# Patient Record
Sex: Female | Born: 1965 | Race: White | Hispanic: No | Marital: Married | Smoking: Former smoker
Health system: Southern US, Community
[De-identification: ages and names within clinical notes are randomized; demographics above are authoritative.]

## PROBLEM LIST (undated history)

## (undated) DIAGNOSIS — T7840XA Allergy, unspecified, initial encounter: Secondary | ICD-10-CM

## (undated) DIAGNOSIS — R87610 Atypical squamous cells of undetermined significance on cytologic smear of cervix (ASC-US): Secondary | ICD-10-CM

## (undated) HISTORY — DX: Allergy, unspecified, initial encounter: T78.40XA

## (undated) HISTORY — DX: Atypical squamous cells of undetermined significance on cytologic smear of cervix (ASC-US): R87.610

## (undated) HISTORY — PX: COLONOSCOPY: SHX174

---

## 2004-05-27 HISTORY — PX: BREAST CYST ASPIRATION: SHX578

## 2004-07-07 ENCOUNTER — Inpatient Hospital Stay: Payer: Self-pay | Admitting: Obstetrics & Gynecology

## 2006-08-19 ENCOUNTER — Ambulatory Visit: Payer: Self-pay | Admitting: Gastroenterology

## 2008-05-17 ENCOUNTER — Ambulatory Visit: Payer: Self-pay | Admitting: Family Medicine

## 2009-05-23 ENCOUNTER — Ambulatory Visit: Payer: Self-pay | Admitting: Family Medicine

## 2009-06-27 LAB — PSA: PSA: NEGATIVE

## 2010-05-25 ENCOUNTER — Ambulatory Visit: Payer: Self-pay

## 2011-11-13 ENCOUNTER — Ambulatory Visit: Payer: Self-pay | Admitting: Family Medicine

## 2014-05-09 ENCOUNTER — Ambulatory Visit: Payer: Self-pay | Admitting: Family Medicine

## 2014-05-16 LAB — BASIC METABOLIC PANEL
BUN: 9 mg/dL (ref 4–21)
Creatinine: 0.7 mg/dL (ref 0.5–1.1)
Glucose: 97 mg/dL
Potassium: 4.4 mmol/L (ref 3.4–5.3)
SODIUM: 140 mmol/L (ref 137–147)

## 2014-05-16 LAB — LIPID PANEL
Cholesterol: 186 mg/dL (ref 0–200)
HDL: 65 mg/dL (ref 35–70)
LDL CALC: 110 mg/dL
Triglycerides: 54 mg/dL (ref 40–160)

## 2014-05-24 ENCOUNTER — Ambulatory Visit: Payer: Self-pay | Admitting: Family Medicine

## 2014-05-24 LAB — HM MAMMOGRAPHY

## 2014-11-25 ENCOUNTER — Encounter: Payer: Self-pay | Admitting: Emergency Medicine

## 2014-11-25 ENCOUNTER — Ambulatory Visit (INDEPENDENT_AMBULATORY_CARE_PROVIDER_SITE_OTHER): Payer: BC Managed Care – PPO | Admitting: Family Medicine

## 2014-11-25 ENCOUNTER — Encounter: Payer: Self-pay | Admitting: Family Medicine

## 2014-11-25 VITALS — BP 118/60 | HR 88 | Temp 98.3°F | Resp 16 | Wt 176.0 lb

## 2014-11-25 DIAGNOSIS — N3090 Cystitis, unspecified without hematuria: Secondary | ICD-10-CM | POA: Insufficient documentation

## 2014-11-25 LAB — POCT URINALYSIS DIPSTICK
Bilirubin, UA: NEGATIVE
Glucose, UA: NEGATIVE
Ketones, UA: NEGATIVE
Nitrite, UA: POSITIVE
PROTEIN UA: NEGATIVE
SPEC GRAV UA: 1.01
Urobilinogen, UA: 0.2
pH, UA: 6

## 2014-11-25 MED ORDER — NITROFURANTOIN MONOHYD MACRO 100 MG PO CAPS
100.0000 mg | ORAL_CAPSULE | Freq: Two times a day (BID) | ORAL | Status: DC
Start: 2014-11-25 — End: 2020-02-03

## 2014-11-25 NOTE — Progress Notes (Signed)
Subjective:     Patient ID: Nicole Beard, female   DOB: 16-Sep-1965, 49 y.o.   MRN: [id number]  HPI  Chief Complaint  Patient presents with  . Urinary Tract Infection  "I've never had a urinary tract infection." Reports burning, urgency, and low back pain relieved by Uristat over the last week.   Review of Systems  Constitutional: Negative for fever and chills.       Objective:   Physical Exam  Constitutional: She appears well-developed and well-nourished. No distress.  Genitourinary:  No cva tenderness       Assessment:    1. Cystitis - nitrofurantoin, macrocrystal-monohydrate, (MACROBID) 100 MG capsule; Take 1 capsule (100 mg total) by mouth 2 (two) times daily.  Dispense: 14 capsule; Refill: 0 - POCT urinalysis dipstick - Urine culture    Plan:    Further f/u pending urine culture.

## 2014-11-25 NOTE — Patient Instructions (Signed)
Continue increased fluids and Uristat as needed

## 2015-07-11 ENCOUNTER — Encounter: Payer: Self-pay | Admitting: Family Medicine

## 2015-12-07 DIAGNOSIS — R87610 Atypical squamous cells of undetermined significance on cytologic smear of cervix (ASC-US): Secondary | ICD-10-CM

## 2015-12-07 HISTORY — DX: Atypical squamous cells of undetermined significance on cytologic smear of cervix (ASC-US): R87.610

## 2015-12-28 ENCOUNTER — Other Ambulatory Visit: Payer: Self-pay | Admitting: Family Medicine

## 2016-01-12 ENCOUNTER — Other Ambulatory Visit: Payer: Self-pay | Admitting: Obstetrics and Gynecology

## 2016-01-12 ENCOUNTER — Other Ambulatory Visit: Payer: Self-pay | Admitting: Obstetrics & Gynecology

## 2016-01-12 DIAGNOSIS — Z1231 Encounter for screening mammogram for malignant neoplasm of breast: Secondary | ICD-10-CM

## 2016-01-25 ENCOUNTER — Other Ambulatory Visit: Payer: Self-pay | Admitting: Obstetrics & Gynecology

## 2016-01-25 ENCOUNTER — Ambulatory Visit
Admission: RE | Admit: 2016-01-25 | Discharge: 2016-01-25 | Disposition: A | Discharge status: Home / Self Care | Payer: BC Managed Care – PPO | Source: Ambulatory Visit | Attending: Obstetrics & Gynecology | Admitting: Obstetrics & Gynecology

## 2016-01-25 DIAGNOSIS — Z1231 Encounter for screening mammogram for malignant neoplasm of breast: Secondary | ICD-10-CM

## 2017-01-20 ENCOUNTER — Ambulatory Visit (INDEPENDENT_AMBULATORY_CARE_PROVIDER_SITE_OTHER): Payer: BC Managed Care – PPO | Admitting: Obstetrics & Gynecology

## 2017-01-20 ENCOUNTER — Encounter: Payer: Self-pay | Admitting: Obstetrics & Gynecology

## 2017-01-20 VITALS — BP 120/82 | HR 84 | Ht 66.0 in | Wt 182.0 lb

## 2017-01-20 DIAGNOSIS — Z1211 Encounter for screening for malignant neoplasm of colon: Secondary | ICD-10-CM

## 2017-01-20 DIAGNOSIS — Z01419 Encounter for gynecological examination (general) (routine) without abnormal findings: Secondary | ICD-10-CM

## 2017-01-20 DIAGNOSIS — Z1231 Encounter for screening mammogram for malignant neoplasm of breast: Secondary | ICD-10-CM

## 2017-01-20 DIAGNOSIS — Z0000 Encounter for general adult medical examination without abnormal findings: Secondary | ICD-10-CM

## 2017-01-20 DIAGNOSIS — E559 Vitamin D deficiency, unspecified: Secondary | ICD-10-CM

## 2017-01-20 DIAGNOSIS — Z1239 Encounter for other screening for malignant neoplasm of breast: Secondary | ICD-10-CM

## 2017-01-20 DIAGNOSIS — Z87410 Personal history of cervical dysplasia: Secondary | ICD-10-CM | POA: Insufficient documentation

## 2017-01-20 NOTE — Progress Notes (Signed)
HPI:      [name] is a 51 y.o. [id number] who LMP was Patient's last menstrual period was 12/31/2016 (within days)., she presents today for her annual examination. The patient has no complaints today. The patient is sexually active. Her last pap: approximate date ASCUS, HPV NEG and was abnormal: 2017 and last mammogram: approximate date 2017 and was normal. The patient does perform self breast exams.  There is no notable family history of breast or ovarian cancer in her family.  The patient has regular exercise: yes.  The patient denies current symptoms of depression.   Still having periods.  No menpausal sx.s  GYN History: Contraception: none  PMHx: Past Medical History:  Diagnosis Date  . Atypical squamous cell changes of undetermined significance (ASCUS) on cervical cytology with negative high risk human papilloma virus (HPV) test result 12/07/2015   Past Surgical History:  Procedure Laterality Date  . BREAST CYST ASPIRATION Right 2006   neg   Family History  Problem Relation Age of Onset  . Cancer Mother        colon cancer  . Diabetes Father   . Heart disease Father   . Breast cancer Neg Hx   . Ovarian cancer Neg Hx    Social History  Substance Use Topics  . Smoking status: Former Research scientist (life sciences)  . Smokeless tobacco: Never Used  . Alcohol use No    Current Outpatient Prescriptions:  .  nitrofurantoin, macrocrystal-monohydrate, (MACROBID) 100 MG capsule, Take 1 capsule (100 mg total) by mouth 2 (two) times daily. (Patient not taking: Reported on 01/20/2017), Disp: 14 capsule, Rfl: 0 Allergies: Codeine and Penicillins  Review of Systems  Constitutional: Negative for chills, fever and malaise/fatigue.  HENT: Negative for congestion, sinus pain and sore throat.   Eyes: Negative for blurred vision and pain.  Respiratory: Negative for cough and wheezing.   Cardiovascular: Negative for chest pain and leg swelling.  Gastrointestinal: Negative for abdominal pain,  constipation, diarrhea, heartburn, nausea and vomiting.  Genitourinary: Negative for dysuria, frequency, hematuria and urgency.  Musculoskeletal: Negative for back pain, joint pain, myalgias and neck pain.  Skin: Negative for itching and rash.  Neurological: Negative for dizziness, tremors and weakness.  Endo/Heme/Allergies: Does not bruise/bleed easily.  Psychiatric/Behavioral: Negative for depression. The patient is not nervous/anxious and does not have insomnia.     Objective: BP 120/82   Pulse 84   Ht 5\' 6"  (1.676 m)   Wt 182 lb (82.6 kg)   LMP 12/31/2016 (Within Days)   BMI 29.38 kg/m   Filed Weights   01/20/17 0938  Weight: 182 lb (82.6 kg)   Body mass index is 29.38 kg/m. Physical Exam  Constitutional: She is oriented to person, place, and time. She appears well-developed and well-nourished. No distress.  Genitourinary: Rectum normal, vagina normal and uterus normal. Pelvic exam was performed with patient supine. There is no rash or lesion on the right labia. There is no rash or lesion on the left labia. Vagina exhibits no lesion. No bleeding in the vagina. Right adnexum does not display mass and does not display tenderness. Left adnexum does not display mass and does not display tenderness. Cervix does not exhibit motion tenderness, lesion, friability or polyp.   Uterus is mobile and midaxial. Uterus is not enlarged or exhibiting a mass.  HENT:  Head: Normocephalic and atraumatic. Head is without laceration.  Right Ear: Hearing normal.  Left Ear: Hearing normal.  Nose: No epistaxis.  No foreign bodies.  Mouth/Throat: Uvula is midline, oropharynx is clear and moist and mucous membranes are normal.  Eyes: Pupils are equal, round, and reactive to light.  Neck: Normal range of motion. Neck supple. No thyromegaly present.  Cardiovascular: Normal rate and regular rhythm.  Exam reveals no gallop and no friction rub.   No murmur heard. Pulmonary/Chest: Effort normal and breath  sounds normal. No respiratory distress. She has no wheezes. Right breast exhibits no mass, no skin change and no tenderness. Left breast exhibits no mass, no skin change and no tenderness.  Abdominal: Soft. Bowel sounds are normal. She exhibits no distension. There is no tenderness. There is no rebound.  Musculoskeletal: Normal range of motion.  Neurological: She is alert and oriented to person, place, and time. No cranial nerve deficit.  Skin: Skin is warm and dry.  Psychiatric: She has a normal mood and affect. Judgment normal.  Vitals reviewed.   Assessment:  ANNUAL EXAM 1. Annual physical exam   2. Screening for breast cancer   3. History of cervical dysplasia   4. Screen for colon cancer   5. Vitamin D deficiency      Screening Plan:            1.  Cervical Screening-  Pap smear done today  2. Breast screening- Exam annually and mammogram>40 planned   3. Colonoscopy every 10 years, Hemoccult testing - after age 7  4. Labs Vit D f/u test today.  Other labs normal last year  5. Counseling for contraception: no method  Other:  1. Annual physical exam  2. Screening for breast cancer - MM DIGITAL SCREENING BILATERAL; Future  3. History of cervical dysplasia - IGP, Aptima HPV  4. Screen for colon cancer - Ambulatory referral to Gastroenterology  5. Vitamin D deficiency - VITAMIN D 25 Hydroxy (Vit-D Deficiency, Fractures)      F/U  Return in about 1 year (around 01/20/2018) for Annual.  Barnett Applebaum, MD, [facility], [facility] 01/20/2017  10:05 AM

## 2017-01-20 NOTE — Patient Instructions (Signed)
PAP every year Mammogram every year    Call [phone] to schedule at [facility] Colonoscopy every 10 years Labs yearly (Vit D)

## 2017-01-21 ENCOUNTER — Other Ambulatory Visit: Payer: Self-pay | Admitting: Obstetrics & Gynecology

## 2017-01-21 LAB — VITAMIN D 25 HYDROXY (VIT D DEFICIENCY, FRACTURES): VIT D 25 HYDROXY: 18.3 ng/mL — AB (ref 30.0–100.0)

## 2017-01-21 MED ORDER — VITAMIN D (ERGOCALCIFEROL) 1.25 MG (50000 UNIT) PO CAPS: 50000.0000 [IU] | ORAL_CAPSULE | ORAL | 2 refills | Status: DC

## 2017-01-21 NOTE — Progress Notes (Signed)
Needs Rx for Vit D, message sent to patient on MyChart but do not have pharmacy.  Please call and find out pharmacy and then send Rx by fax.

## 2017-01-28 LAB — IGP, APTIMA HPV
HPV Aptima: NEGATIVE
PAP Smear Comment: 0

## 2017-01-28 LAB — PLEASE NOTE

## 2017-03-12 ENCOUNTER — Other Ambulatory Visit: Payer: Self-pay

## 2017-03-12 ENCOUNTER — Telehealth: Payer: Self-pay

## 2017-03-12 DIAGNOSIS — Z800 Family history of malignant neoplasm of digestive organs: Secondary | ICD-10-CM

## 2017-03-12 NOTE — Telephone Encounter (Signed)
Gastroenterology Pre-Procedure Review  Request Date: 05/12/17 Requesting Physician: Dr. Vicente Males  PATIENT REVIEW QUESTIONS: The patient responded to the following health history questions as indicated:    1. Are you having any GI issues? no 2. Do you have a personal history of Polyps? no 3. Do you have a family history of Colon Cancer or Polyps? yes (Mom Colon Cancer) 4. Diabetes Mellitus? no 5. Joint replacements in the past 12 months?no 6. Major health problems in the past 3 months?no 7. Any artificial heart valves, MVP, or defibrillator?no    MEDICATIONS & ALLERGIES:    Patient reports the following regarding taking any anticoagulation/antiplatelet therapy:   Plavix, Coumadin, Eliquis, Xarelto, Lovenox, Pradaxa, Brilinta, or Effient? no Aspirin? no  Patient confirms/reports the following medications:  Current Outpatient Prescriptions  Medication Sig Dispense Refill  . nitrofurantoin, macrocrystal-monohydrate, (MACROBID) 100 MG capsule Take 1 capsule (100 mg total) by mouth 2 (two) times daily. (Patient not taking: Reported on 01/20/2017) 14 capsule 0  . Vitamin D, Ergocalciferol, (DRISDOL) 50000 units CAPS capsule Take 1 capsule (50,000 Units total) by mouth every 7 (seven) days. For 3 months. 4 capsule 2   No current facility-administered medications for this visit.     Patient confirms/reports the following allergies:  Allergies  Allergen Reactions  . Codeine Nausea And Vomiting and Other (See Comments)  . Penicillins Rash    No orders of the defined types were placed in this encounter.   AUTHORIZATION INFORMATION Primary Insurance: 1D#: Group #:  Secondary Insurance: 1D#: Group #:  SCHEDULE INFORMATION: Date: 05/12/17 Time: Location:[facility]

## 2017-04-02 ENCOUNTER — Other Ambulatory Visit: Payer: Self-pay | Admitting: Obstetrics & Gynecology

## 2017-04-02 DIAGNOSIS — E559 Vitamin D deficiency, unspecified: Secondary | ICD-10-CM

## 2017-04-03 NOTE — Telephone Encounter (Signed)
Schedule appt for VIT D level (LAB ordered) and have her change from Rx therapy to over the counter 1000 U per day therapy of Vit D.

## 2017-04-03 NOTE — Telephone Encounter (Signed)
Left message to return call 

## 2017-04-16 ENCOUNTER — Other Ambulatory Visit: Payer: BC Managed Care – PPO

## 2017-04-16 DIAGNOSIS — E559 Vitamin D deficiency, unspecified: Secondary | ICD-10-CM

## 2017-04-17 LAB — VITAMIN D 25 HYDROXY (VIT D DEFICIENCY, FRACTURES): VIT D 25 HYDROXY: 22.7 ng/mL — AB (ref 30.0–100.0)

## 2017-05-12 ENCOUNTER — Ambulatory Visit: Payer: BC Managed Care – PPO | Admitting: Certified Registered"

## 2017-05-12 ENCOUNTER — Encounter: Payer: Self-pay | Admitting: *Deleted

## 2017-05-12 ENCOUNTER — Encounter
Admission: RE | Disposition: A | Discharge status: Home / Self Care | Payer: Self-pay | Source: Ambulatory Visit | Attending: Gastroenterology

## 2017-05-12 ENCOUNTER — Ambulatory Visit
Admission: RE | Admit: 2017-05-12 | Discharge: 2017-05-12 | Disposition: A | Discharge status: Home / Self Care | Payer: BC Managed Care – PPO | Source: Ambulatory Visit | Attending: Gastroenterology | Admitting: Gastroenterology

## 2017-05-12 DIAGNOSIS — Z880 Allergy status to penicillin: Secondary | ICD-10-CM | POA: Diagnosis not present

## 2017-05-12 DIAGNOSIS — Z1211 Encounter for screening for malignant neoplasm of colon: Secondary | ICD-10-CM | POA: Insufficient documentation

## 2017-05-12 DIAGNOSIS — Z800 Family history of malignant neoplasm of digestive organs: Secondary | ICD-10-CM | POA: Diagnosis not present

## 2017-05-12 DIAGNOSIS — Z87891 Personal history of nicotine dependence: Secondary | ICD-10-CM | POA: Insufficient documentation

## 2017-05-12 DIAGNOSIS — D122 Benign neoplasm of ascending colon: Secondary | ICD-10-CM | POA: Diagnosis not present

## 2017-05-12 DIAGNOSIS — D120 Benign neoplasm of cecum: Secondary | ICD-10-CM | POA: Insufficient documentation

## 2017-05-12 DIAGNOSIS — K5730 Diverticulosis of large intestine without perforation or abscess without bleeding: Secondary | ICD-10-CM

## 2017-05-12 DIAGNOSIS — Z885 Allergy status to narcotic agent status: Secondary | ICD-10-CM | POA: Insufficient documentation

## 2017-05-12 HISTORY — PX: COLONOSCOPY WITH PROPOFOL: SHX5780

## 2017-05-12 LAB — POCT PREGNANCY, URINE: PREG TEST UR: NEGATIVE

## 2017-05-12 SURGERY — COLONOSCOPY WITH PROPOFOL
Anesthesia: General

## 2017-05-12 MED ORDER — MIDAZOLAM HCL 2 MG/2ML IJ SOLN
INTRAMUSCULAR | Status: AC
  Filled 2017-05-12: qty 2

## 2017-05-12 MED ORDER — PROPOFOL 500 MG/50ML IV EMUL
INTRAVENOUS | Status: DC | PRN
  Administered 2017-05-12: 120 ug/kg/min via INTRAVENOUS

## 2017-05-12 MED ORDER — LIDOCAINE 2% (20 MG/ML) 5 ML SYRINGE
INTRAMUSCULAR | Status: DC | PRN
  Administered 2017-05-12: 25 mg via INTRAVENOUS

## 2017-05-12 MED ORDER — SODIUM CHLORIDE 0.9 % IV SOLN
INTRAVENOUS | Status: DC
  Administered 2017-05-12: 08:00:00 via INTRAVENOUS

## 2017-05-12 MED ORDER — PROPOFOL 10 MG/ML IV BOLUS
INTRAVENOUS | Status: DC | PRN
  Administered 2017-05-12: 30 mg via INTRAVENOUS
  Administered 2017-05-12: 70 mg via INTRAVENOUS

## 2017-05-12 MED ORDER — MIDAZOLAM HCL 5 MG/5ML IJ SOLN
INTRAMUSCULAR | Status: DC | PRN
  Administered 2017-05-12: 2 mg via INTRAVENOUS

## 2017-05-12 MED ORDER — PROPOFOL 500 MG/50ML IV EMUL
INTRAVENOUS | Status: AC
  Filled 2017-05-12: qty 150

## 2017-05-12 NOTE — Anesthesia Postprocedure Evaluation (Signed)
Anesthesia Post Note  Patient: Nicole Beard  Procedure(s) Performed: COLONOSCOPY WITH PROPOFOL (N/A )  Patient location during evaluation: PACU Anesthesia Type: General Level of consciousness: awake and alert Pain management: pain level controlled Vital Signs Assessment: post-procedure vital signs reviewed and stable Respiratory status: spontaneous breathing, nonlabored ventilation, respiratory function stable and patient connected to nasal cannula oxygen Cardiovascular status: blood pressure returned to baseline and stable Postop Assessment: no apparent nausea or vomiting Anesthetic complications: no     Last Vitals:  Vitals:   05/12/17 0850 05/12/17 0900  BP: 103/71 113/86  Pulse: 73 74  Resp: 17 (!) 21  Temp:    SpO2: 100% 100%    Last Pain:  Vitals:   05/12/17 0900  TempSrc:   PainSc: 0-No pain                 Molli Barrows

## 2017-05-12 NOTE — H&P (Signed)
Jonathon Bellows, MD [address] [place], [place], [place], [address] [address], [place], [place], [place], [address] Phone: [phone]  Fax: [phone]  Primary Care Physician:  Carmon Ginsberg, [place]   Pre-Procedure History & Physical: HPI:  Nicole Beard is a 51 y.o. female is here for an colonoscopy.   Past Medical History:  Diagnosis Date  . Atypical squamous cell changes of undetermined significance (ASCUS) on cervical cytology with negative high risk human papilloma virus (HPV) test result 12/07/2015    Past Surgical History:  Procedure Laterality Date  . BREAST CYST ASPIRATION Right 2006   neg  . COLONOSCOPY      Prior to Admission medications   Medication Sig Start Date End Date Taking? Authorizing Provider  Vitamin D, Ergocalciferol, (DRISDOL) 50000 units CAPS capsule Take 1 capsule (50,000 Units total) by mouth every 7 (seven) days. For 3 months. 01/21/17  Yes Gae Dry, MD  nitrofurantoin, macrocrystal-monohydrate, (MACROBID) 100 MG capsule Take 1 capsule (100 mg total) by mouth 2 (two) times daily. Patient not taking: Reported on 01/20/2017 11/25/14   Carmon Ginsberg, PA    Allergies as of 03/12/2017 - Review Complete 01/20/2017  Allergen Reaction Noted  . Codeine Nausea And Vomiting and Other (See Comments) 11/25/2014  . Penicillins Rash 11/25/2014    Family History  Problem Relation Age of Onset  . Cancer Mother        colon cancer  . Diabetes Father   . Heart disease Father   . Breast cancer Neg Hx   . Ovarian cancer Neg Hx     Social History   Socioeconomic History  . Marital status: Married    Spouse name: Not on file  . Number of children: Not on file  . Years of education: Not on file  . Highest education level: Not on file  Social Needs  . Financial resource strain: Not on file  . Food insecurity - worry: Not on file  . Food insecurity - inability: Not on file  . Transportation needs - medical: Not on file  .  Transportation needs - non-medical: Not on file  Occupational History  . Not on file  Tobacco Use  . Smoking status: Former Research scientist (life sciences)  . Smokeless tobacco: Never Used  Substance and Sexual Activity  . Alcohol use: No  . Drug use: No  . Sexual activity: Not on file  Other Topics Concern  . Not on file  Social History Narrative  . Not on file    Review of Systems: See HPI, otherwise negative ROS  Physical Exam: BP 121/69   Pulse 81   Temp 97.7 F (36.5 C) (Tympanic)   Resp 14   Ht 5\' 6"  (1.676 m)   Wt 182 lb (82.6 kg)   BMI 29.38 kg/m  General:   Alert,  pleasant and cooperative in NAD Head:  Normocephalic and atraumatic. Neck:  Supple; no masses or thyromegaly. Lungs:  Clear throughout to auscultation, normal respiratory effort.    Heart:  +S1, +S2, Regular rate and rhythm, No edema. Abdomen:  Soft, nontender and nondistended. Normal bowel sounds, without guarding, and without rebound.   Neurologic:  Alert and  oriented x4;  grossly normal neurologically.  Impression/Plan: Nicole Beard is here for an colonoscopy to be performed for family history of colon cancer .   Risks, benefits, limitations, and alternatives regarding  colonoscopy have been reviewed with the patient.  Questions have been answered.  All parties agreeable.  Jonathon Bellows, MD  05/12/2017, 8:03 AM

## 2017-05-12 NOTE — Anesthesia Preprocedure Evaluation (Signed)
Anesthesia Evaluation  Patient identified by MRN, date of birth, ID band Patient awake    Reviewed: Allergy & Precautions, H&P , NPO status , Patient's Chart, lab work & pertinent test results, reviewed documented beta blocker date and time   Airway Mallampati: II   Neck ROM: full    Dental  (+) Poor Dentition   Pulmonary neg pulmonary ROS, former smoker,    Pulmonary exam normal        Cardiovascular Exercise Tolerance: Good negative cardio ROS Normal cardiovascular exam Rhythm:regular Rate:Normal     Neuro/Psych negative neurological ROS  negative psych ROS   GI/Hepatic negative GI ROS, Neg liver ROS,   Endo/Other  negative endocrine ROS  Renal/GU negative Renal ROS  negative genitourinary   Musculoskeletal   Abdominal   Peds  Hematology negative hematology ROS (+)   Anesthesia Other Findings Past Medical History: 12/07/2015: Atypical squamous cell changes of undetermined  significance (ASCUS) on cervical cytology with negative high risk  human papilloma virus (HPV) test result Past Surgical History: 2006: BREAST CYST ASPIRATION; Right     Comment:  neg No date: COLONOSCOPY   Reproductive/Obstetrics negative OB ROS                             Anesthesia Physical Anesthesia Plan  ASA: I  Anesthesia Plan: General   Post-op Pain Management:    Induction:   PONV Risk Score and Plan:   Airway Management Planned:   Additional Equipment:   Intra-op Plan:   Post-operative Plan:   Informed Consent: I have reviewed the patients History and Physical, chart, labs and discussed the procedure including the risks, benefits and alternatives for the proposed anesthesia with the patient or authorized representative who has indicated his/her understanding and acceptance.   Dental Advisory Given  Plan Discussed with: CRNA  Anesthesia Plan Comments:         Anesthesia Quick  Evaluation

## 2017-05-12 NOTE — Anesthesia Post-op Follow-up Note (Signed)
Anesthesia QCDR form completed.        

## 2017-05-12 NOTE — Op Note (Signed)
[facility] Gastroenterology Patient Name: Nicole Beard Procedure Date: 05/12/2017 7:51 AM MRN: [id number] Account #: [id number] Date of Birth: April 01, 1966 Admit Type: Outpatient Age: 51 Room: [facility] ENDO ROOM 4 Gender: Female Note Status: Finalized Procedure:            Colonoscopy Indications:          Screening in patient at increased risk: Family history                        of 1st-degree relative with colorectal cancer Providers:            Jonathon Bellows MD, MD Referring MD:         Barnett Applebaum, MD (Referring MD) Medicines:            Monitored Anesthesia Care Complications:        No immediate complications. Procedure:            Pre-Anesthesia Assessment:                       - Prior to the procedure, a History and Physical was                        performed, and patient medications, allergies and                        sensitivities were reviewed. The patient's tolerance of                        previous anesthesia was reviewed.                       - The risks and benefits of the procedure and the                        sedation options and risks were discussed with the                        patient. All questions were answered and informed                        consent was obtained.                       - ASA Grade Assessment: I - A normal, healthy patient.                       After obtaining informed consent, the colonoscope was                        passed under direct vision. Throughout the procedure,                        the patient's blood pressure, pulse, and oxygen                        saturations were monitored continuously. The                        Colonoscope was introduced through the anus and  advanced to the the cecum, identified by the                        appendiceal orifice, IC valve and transillumination.                        The colonoscopy was performed with ease. The patient         tolerated the procedure well. The quality of the bowel                        preparation was good. Findings:      The perianal and digital rectal examinations were normal.      A few sessile polyps were found in the cecum. The polyps were 3 to 4 mm       in size. These polyps were removed with a cold biopsy forceps. Resection       and retrieval were complete.      A 5 mm polyp was found in the ascending colon. The polyp was sessile.       The polyp was removed with a cold biopsy forceps. Resection and       retrieval were complete.      Multiple small-mouthed diverticula were found in the sigmoid colon.      The exam was otherwise without abnormality on direct and retroflexion       views. Impression:           - A few 3 to 4 mm polyps in the cecum, removed with a                        cold biopsy forceps. Resected and retrieved.                       - One 5 mm polyp in the ascending colon, removed with a                        cold biopsy forceps. Resected and retrieved.                       - Diverticulosis in the sigmoid colon.                       - The examination was otherwise normal on direct and                        retroflexion views. Recommendation:       - Discharge patient to home (with escort).                       - Resume previous diet.                       - Continue present medications.                       - Await pathology results.                       - Repeat colonoscopy in 3 years for surveillance. Procedure Code(s):    --- Professional ---  45380, Colonoscopy, flexible; with biopsy, single or                        multiple Diagnosis Code(s):    --- Professional ---                       Z80.0, Family history of malignant neoplasm of                        digestive organs                       D12.0, Benign neoplasm of cecum                       D12.2, Benign neoplasm of ascending colon                       K57.30,  Diverticulosis of large intestine without                        perforation or abscess without bleeding CPT copyright 2016 American Medical Association. All rights reserved. The codes documented in this report are preliminary and upon coder review may  be revised to meet current compliance requirements. Jonathon Bellows, MD Jonathon Bellows MD, MD 05/12/2017 8:28:44 AM This report has been signed electronically. Number of Addenda: 0 Note Initiated On: 05/12/2017 7:51 AM Scope Withdrawal Time: 0 hours 16 minutes 19 seconds  Total Procedure Duration: 0 hours 18 minutes 10 seconds       [facility]

## 2017-05-12 NOTE — Transfer of Care (Signed)
Immediate Anesthesia Transfer of Care Note  Patient: Nicole Beard  Procedure(s) Performed: COLONOSCOPY WITH PROPOFOL (N/A )  Patient Location: Endoscopy Unit  Anesthesia Type:General  Level of Consciousness: awake  Airway & Oxygen Therapy: Patient Spontanous Breathing and Patient connected to nasal cannula oxygen  Post-op Assessment: Report given to RN and Post -op Vital signs reviewed and stable  Post vital signs: Reviewed  Last Vitals:  Vitals:   05/12/17 0730 05/12/17 0831  BP: 121/69 106/69  Pulse: 81 78  Resp: 14 18  Temp: 36.5 C (!) 36.2 C  SpO2:  98%    Last Pain:  Vitals:   05/12/17 0730  TempSrc: Tympanic         Complications: No apparent anesthesia complications

## 2017-05-13 ENCOUNTER — Encounter: Payer: Self-pay | Admitting: Gastroenterology

## 2017-05-13 LAB — SURGICAL PATHOLOGY

## 2017-05-14 ENCOUNTER — Ambulatory Visit
Admission: RE | Admit: 2017-05-14 | Discharge: 2017-05-14 | Disposition: A | Discharge status: Home / Self Care | Payer: BC Managed Care – PPO | Source: Ambulatory Visit | Attending: Obstetrics & Gynecology | Admitting: Obstetrics & Gynecology

## 2017-05-14 DIAGNOSIS — Z1231 Encounter for screening mammogram for malignant neoplasm of breast: Secondary | ICD-10-CM | POA: Insufficient documentation

## 2017-05-14 DIAGNOSIS — Z1239 Encounter for other screening for malignant neoplasm of breast: Secondary | ICD-10-CM

## 2017-12-18 ENCOUNTER — Telehealth: Payer: Self-pay

## 2017-12-18 NOTE — Telephone Encounter (Signed)
Called patient to let her know that Dr. Rosanna Randy will take JR son as a new patient. The patient is 45, and former PCP was Dr. Barbera Setters.   Looks like next New pt/Physical slot is not until mid Nov. Called to see if this would be ok for them.

## 2018-01-21 ENCOUNTER — Other Ambulatory Visit (HOSPITAL_COMMUNITY)
Admission: RE | Admit: 2018-01-21 | Discharge: 2018-01-21 | Disposition: A | Discharge status: Home / Self Care | Payer: BC Managed Care – PPO | Source: Ambulatory Visit | Attending: Obstetrics & Gynecology | Admitting: Obstetrics & Gynecology

## 2018-01-21 ENCOUNTER — Encounter: Payer: Self-pay | Admitting: Obstetrics & Gynecology

## 2018-01-21 ENCOUNTER — Ambulatory Visit (INDEPENDENT_AMBULATORY_CARE_PROVIDER_SITE_OTHER): Payer: BC Managed Care – PPO | Admitting: Obstetrics & Gynecology

## 2018-01-21 VITALS — BP 120/80 | Ht 65.0 in | Wt 181.0 lb

## 2018-01-21 DIAGNOSIS — Z87410 Personal history of cervical dysplasia: Secondary | ICD-10-CM

## 2018-01-21 DIAGNOSIS — Z1239 Encounter for other screening for malignant neoplasm of breast: Secondary | ICD-10-CM

## 2018-01-21 DIAGNOSIS — Z1151 Encounter for screening for human papillomavirus (HPV): Secondary | ICD-10-CM | POA: Insufficient documentation

## 2018-01-21 DIAGNOSIS — Z8639 Personal history of other endocrine, nutritional and metabolic disease: Secondary | ICD-10-CM | POA: Diagnosis not present

## 2018-01-21 DIAGNOSIS — Z01419 Encounter for gynecological examination (general) (routine) without abnormal findings: Secondary | ICD-10-CM

## 2018-01-21 DIAGNOSIS — Z1231 Encounter for screening mammogram for malignant neoplasm of breast: Secondary | ICD-10-CM

## 2018-01-21 DIAGNOSIS — Z0000 Encounter for general adult medical examination without abnormal findings: Secondary | ICD-10-CM

## 2018-01-21 NOTE — Progress Notes (Signed)
HPI:      [name] is a 52 y.o. [id number] who LMP was Patient's last menstrual period was 01/05/2018., she presents today for her annual examination. The patient has no complaints today. The patient is sexually active. Her last pap: approximate date 2018 and was normal and last mammogram: approximate date 2018 (Dec) and was normal. The patient does perform self breast exams.  There is no notable family history of breast or ovarian cancer in her family.  The patient has regular exercise: yes.  The patient denies current symptoms of depression.    GYN History: Contraception: none  PMHx: Past Medical History:  Diagnosis Date  . Atypical squamous cell changes of undetermined significance (ASCUS) on cervical cytology with negative high risk human papilloma virus (HPV) test result 12/07/2015   Past Surgical History:  Procedure Laterality Date  . BREAST CYST ASPIRATION Right 2006   neg  . COLONOSCOPY    . COLONOSCOPY WITH PROPOFOL N/A 05/12/2017   Procedure: COLONOSCOPY WITH PROPOFOL;  Surgeon: Jonathon Bellows, MD;  Location: [facility] ENDOSCOPY;  Service: Gastroenterology;  Laterality: N/A;   Family History  Problem Relation Age of Onset  . Cancer Mother        colon cancer  . Diabetes Father   . Heart disease Father   . Breast cancer Neg Hx   . Ovarian cancer Neg Hx    Social History   Tobacco Use  . Smoking status: Former Research scientist (life sciences)  . Smokeless tobacco: Never Used  Substance Use Topics  . Alcohol use: No  . Drug use: No    Current Outpatient Medications:  Marland Kitchen  Vitamin D, Ergocalciferol, (DRISDOL) 50000 units CAPS capsule, Take 1 capsule (50,000 Units total) by mouth every 7 (seven) days. For 3 months., Disp: 4 capsule, Rfl: 2 .  nitrofurantoin, macrocrystal-monohydrate, (MACROBID) 100 MG capsule, Take 1 capsule (100 mg total) by mouth 2 (two) times daily. (Patient not taking: Reported on 01/20/2017), Disp: 14 capsule, Rfl: 0 Allergies: Codeine and Penicillins  Review of  Systems  Constitutional: Negative for chills, fever and malaise/fatigue.  HENT: Negative for congestion, sinus pain and sore throat.   Eyes: Negative for blurred vision and pain.  Respiratory: Negative for cough and wheezing.   Cardiovascular: Negative for chest pain and leg swelling.  Gastrointestinal: Negative for abdominal pain, constipation, diarrhea, heartburn, nausea and vomiting.  Genitourinary: Negative for dysuria, frequency, hematuria and urgency.  Musculoskeletal: Negative for back pain, joint pain, myalgias and neck pain.  Skin: Negative for itching and rash.  Neurological: Negative for dizziness, tremors and weakness.  Endo/Heme/Allergies: Does not bruise/bleed easily.  Psychiatric/Behavioral: Negative for depression. The patient is not nervous/anxious and does not have insomnia.     Objective: BP 120/80   Ht 5\' 5"  (1.651 m)   Wt 181 lb (82.1 kg)   LMP 01/05/2018   BMI 30.12 kg/m   Filed Weights   01/21/18 1000  Weight: 181 lb (82.1 kg)   Body mass index is 30.12 kg/m. Physical Exam  Constitutional: She is oriented to person, place, and time. She appears well-developed and well-nourished. No distress.  Genitourinary: Rectum normal, vagina normal and uterus normal. Pelvic exam was performed with patient supine. There is no rash or lesion on the right labia. There is no rash or lesion on the left labia. Vagina exhibits no lesion. No bleeding in the vagina. Right adnexum does not display mass and does not display tenderness. Left adnexum does not display mass and does not display tenderness.  Cervix does not exhibit motion tenderness, lesion, friability or polyp.   Uterus is mobile and midaxial. Uterus is not enlarged or exhibiting a mass.  Genitourinary Comments: Cervical ectropion, contact bleeding  HENT:  Head: Normocephalic and atraumatic. Head is without laceration.  Right Ear: Hearing normal.  Left Ear: Hearing normal.  Nose: No epistaxis.  No foreign bodies.    Mouth/Throat: Uvula is midline, oropharynx is clear and moist and mucous membranes are normal.  Eyes: Pupils are equal, round, and reactive to light.  Neck: Normal range of motion. Neck supple. No thyromegaly present.  Cardiovascular: Normal rate and regular rhythm. Exam reveals no gallop and no friction rub.  No murmur heard. Pulmonary/Chest: Effort normal and breath sounds normal. No respiratory distress. She has no wheezes. Right breast exhibits no mass, no skin change and no tenderness. Left breast exhibits no mass, no skin change and no tenderness.  Abdominal: Soft. Bowel sounds are normal. She exhibits no distension. There is no tenderness. There is no rebound.  Musculoskeletal: Normal range of motion.  Neurological: She is alert and oriented to person, place, and time. No cranial nerve deficit.  Skin: Skin is warm and dry.  Psychiatric: She has a normal mood and affect. Judgment normal.  Vitals reviewed.   Assessment:  ANNUAL EXAM 1. Annual physical exam   2. History of cervical dysplasia   3. Screening for breast cancer   4. History of vitamin D deficiency      Screening Plan:            1.  Cervical Screening-  Pap smear done today  2. Breast screening- Exam annually and mammogram>40 planned   3. Colonoscopy every 10 years, Hemoccult testing - after age 62  4. Labs Ordered today  5. Counseling for contraception: no method, counseled as to options  6. History of vitamin D deficiency - VITAMIN D 25 Hydroxy (Vit-D Deficiency, Fractures)      F/U  Return in about 1 year (around 01/22/2019) for Annual.  Barnett Applebaum, MD, [facility], [facility] 01/21/2018  10:46 AM

## 2018-01-21 NOTE — Patient Instructions (Signed)
PAP every  years Mammogram every year    Call [phone] to schedule at [facility], for [place] Colonoscopy every 10 years Labs today,  Next year fasting

## 2018-01-22 LAB — VITAMIN D 25 HYDROXY (VIT D DEFICIENCY, FRACTURES): Vit D, 25-Hydroxy: 22.2 ng/mL — ABNORMAL LOW (ref 30.0–100.0)

## 2018-01-23 LAB — CYTOLOGY - PAP
Diagnosis: NEGATIVE
HPV: NOT DETECTED

## 2019-01-26 ENCOUNTER — Encounter: Payer: Self-pay | Admitting: Obstetrics & Gynecology

## 2019-01-26 ENCOUNTER — Other Ambulatory Visit (HOSPITAL_COMMUNITY)
Admission: RE | Admit: 2019-01-26 | Discharge: 2019-01-26 | Disposition: A | Discharge status: Home / Self Care | Payer: BC Managed Care – PPO | Source: Ambulatory Visit | Attending: Obstetrics & Gynecology | Admitting: Obstetrics & Gynecology

## 2019-01-26 ENCOUNTER — Other Ambulatory Visit: Payer: Self-pay

## 2019-01-26 ENCOUNTER — Ambulatory Visit (INDEPENDENT_AMBULATORY_CARE_PROVIDER_SITE_OTHER): Payer: BC Managed Care – PPO | Admitting: Obstetrics & Gynecology

## 2019-01-26 VITALS — BP 122/80 | Ht 66.0 in | Wt 184.0 lb

## 2019-01-26 DIAGNOSIS — Z87410 Personal history of cervical dysplasia: Secondary | ICD-10-CM | POA: Diagnosis present

## 2019-01-26 DIAGNOSIS — Z1329 Encounter for screening for other suspected endocrine disorder: Secondary | ICD-10-CM

## 2019-01-26 DIAGNOSIS — Z131 Encounter for screening for diabetes mellitus: Secondary | ICD-10-CM

## 2019-01-26 DIAGNOSIS — Z13220 Encounter for screening for lipoid disorders: Secondary | ICD-10-CM

## 2019-01-26 DIAGNOSIS — Z8639 Personal history of other endocrine, nutritional and metabolic disease: Secondary | ICD-10-CM

## 2019-01-26 DIAGNOSIS — Z01419 Encounter for gynecological examination (general) (routine) without abnormal findings: Secondary | ICD-10-CM | POA: Diagnosis not present

## 2019-01-26 DIAGNOSIS — Z1239 Encounter for other screening for malignant neoplasm of breast: Secondary | ICD-10-CM

## 2019-01-26 NOTE — Patient Instructions (Addendum)
PAP every one to three years Mammogram every year    Call [phone] to schedule at [facility] Colonoscopy every 10 years Labs soon (fasting)

## 2019-01-26 NOTE — Progress Notes (Signed)
HPI:      [name] is a 53 y.o. [id number] who LMP was Nicole Beard's last menstrual period was 01/18/2019., she presents today for her annual examination. The Nicole Beard has no complaints today. The Nicole Beard is sexually active. Her last pap: approximate date 2019 and was normal and last mammogram: approximate date 2018 and was normal.  (History of LGSIL in 2017).  The Nicole Beard does perform self breast exams.  There is no notable family history of breast or ovarian cancer in her family.  The Nicole Beard has regular exercise: yes.  The Nicole Beard denies current symptoms of depression.    GYN History: Contraception: none  PMHx: Past Medical History:  Diagnosis Date  . Atypical squamous cell changes of undetermined significance (ASCUS) on cervical cytology with negative high risk human papilloma virus (HPV) test result 12/07/2015   Past Surgical History:  Procedure Laterality Date  . BREAST CYST ASPIRATION Right 2006   neg  . COLONOSCOPY    . COLONOSCOPY WITH PROPOFOL N/A 05/12/2017   Procedure: COLONOSCOPY WITH PROPOFOL;  Surgeon: Jonathon Bellows, MD;  Location: [facility] ENDOSCOPY;  Service: Gastroenterology;  Laterality: N/A;   Family History  Problem Relation Age of Onset  . Cancer Mother        colon cancer  . Diabetes Father   . Heart disease Father   . Breast cancer Neg Hx   . Ovarian cancer Neg Hx    Social History   Tobacco Use  . Smoking status: Former Research scientist (life sciences)  . Smokeless tobacco: Never Used  Substance Use Topics  . Alcohol use: No  . Drug use: No    Current Outpatient Medications:  Marland Kitchen  Vitamin D, Ergocalciferol, (DRISDOL) 50000 units CAPS capsule, Take 1 capsule (50,000 Units total) by mouth every 7 (seven) days. For 3 months., Disp: 4 capsule, Rfl: 2 .  nitrofurantoin, macrocrystal-monohydrate, (MACROBID) 100 MG capsule, Take 1 capsule (100 mg total) by mouth 2 (two) times daily. (Nicole Beard not taking: Reported on 01/20/2017), Disp: 14 capsule, Rfl: 0 Allergies: Codeine and  Penicillins  Review of Systems  Constitutional: Negative for chills, fever and malaise/fatigue.  HENT: Negative for congestion, sinus pain and sore throat.   Eyes: Negative for blurred vision and pain.  Respiratory: Negative for cough and wheezing.   Cardiovascular: Negative for chest pain and leg swelling.  Gastrointestinal: Negative for abdominal pain, constipation, diarrhea, heartburn, nausea and vomiting.  Genitourinary: Negative for dysuria, frequency, hematuria and urgency.  Musculoskeletal: Negative for back pain, joint pain, myalgias and neck pain.  Skin: Negative for itching and rash.  Neurological: Negative for dizziness, tremors and weakness.  Endo/Heme/Allergies: Does not bruise/bleed easily.  Psychiatric/Behavioral: Negative for depression. The Nicole Beard is not nervous/anxious and does not have insomnia.     Objective: BP 122/80   Ht 5\' 6"  (1.676 m)   Wt 184 lb (83.5 kg)   LMP 01/18/2019   BMI 29.70 kg/m   Filed Weights   01/26/19 1338  Weight: 184 lb (83.5 kg)   Body mass index is 29.7 kg/m. Physical Exam Constitutional:      General: She is not in acute distress.    Appearance: She is well-developed.  Genitourinary:     Pelvic exam was performed with Nicole Beard supine.     Vagina, uterus and rectum normal.     No lesions in the vagina.     No vaginal bleeding.     No cervical motion tenderness, friability, lesion or polyp.     Uterus is mobile.  Uterus is not enlarged.     No uterine mass detected.    Uterus is midaxial.     No right or left adnexal mass present.     Right adnexa not tender.     Left adnexa not tender.  HENT:     Head: Normocephalic and atraumatic. No laceration.     Right Ear: Hearing normal.     Left Ear: Hearing normal.     Mouth/Throat:     Pharynx: Uvula midline.  Eyes:     Pupils: Pupils are equal, round, and reactive to light.  Neck:     Musculoskeletal: Normal range of motion and neck supple.     Thyroid: No thyromegaly.   Cardiovascular:     Rate and Rhythm: Normal rate and regular rhythm.     Heart sounds: No murmur. No friction rub. No gallop.   Pulmonary:     Effort: Pulmonary effort is normal. No respiratory distress.     Breath sounds: Normal breath sounds. No wheezing.  Chest:     Breasts:        Right: No mass, skin change or tenderness.        Left: No mass, skin change or tenderness.  Abdominal:     General: Bowel sounds are normal. There is no distension.     Palpations: Abdomen is soft.     Tenderness: There is no abdominal tenderness. There is no rebound.  Musculoskeletal: Normal range of motion.  Neurological:     Mental Status: She is alert and oriented to person, place, and time.     Cranial Nerves: No cranial nerve deficit.  Skin:    General: Skin is warm and dry.  Psychiatric:        Judgment: Judgment normal.  Vitals signs reviewed.     Assessment:  ANNUAL EXAM 1. Women's annual routine gynecological examination   2. History of cervical dysplasia   3. Screening for breast cancer   4. History of vitamin D deficiency   5. Screening for cholesterol level   6. Screening for diabetes mellitus   7. Screening for thyroid disorder      Screening Plan:            1.  Cervical Screening-  Pap smear done today  2. Breast screening- Exam annually and mammogram>40 planned   3. Colonoscopy every 10 years, Hemoccult testing - after age 43  4. Labs To return fasting at a later date  Follow up on Vit D as well as has been low last 2 years Cont Vit D 1000 U daily  5. Counseling for contraception: no method     F/U  Return in about 1 year (around 01/26/2020) for Annual; also SOON for Fasting Labs.  Barnett Applebaum, MD, [facility], [facility] 01/26/2019  1:59 PM

## 2019-01-28 LAB — CYTOLOGY - PAP: Diagnosis: NEGATIVE

## 2019-02-05 ENCOUNTER — Other Ambulatory Visit: Payer: BC Managed Care – PPO

## 2019-02-05 ENCOUNTER — Other Ambulatory Visit: Payer: Self-pay | Admitting: Obstetrics & Gynecology

## 2019-02-05 ENCOUNTER — Other Ambulatory Visit: Payer: Self-pay

## 2019-02-05 DIAGNOSIS — Z13220 Encounter for screening for lipoid disorders: Secondary | ICD-10-CM

## 2019-02-05 DIAGNOSIS — Z1329 Encounter for screening for other suspected endocrine disorder: Secondary | ICD-10-CM

## 2019-02-05 DIAGNOSIS — Z8639 Personal history of other endocrine, nutritional and metabolic disease: Secondary | ICD-10-CM

## 2019-02-05 DIAGNOSIS — Z131 Encounter for screening for diabetes mellitus: Secondary | ICD-10-CM

## 2019-02-06 ENCOUNTER — Other Ambulatory Visit: Payer: Self-pay | Admitting: Obstetrics & Gynecology

## 2019-02-06 LAB — LIPID PANEL
Chol/HDL Ratio: 3.4 ratio (ref 0.0–4.4)
Cholesterol, Total: 198 mg/dL (ref 100–199)
HDL: 58 mg/dL (ref >39)
LDL Chol Calc (NIH): 128 mg/dL — ABNORMAL HIGH (ref 0–99)
Triglycerides: 66 mg/dL (ref 0–149)
VLDL Cholesterol Cal: 12 mg/dL (ref 5–40)

## 2019-02-06 LAB — GLUCOSE, FASTING: Glucose, Plasma: 86 mg/dL (ref 65–99)

## 2019-02-06 LAB — VITAMIN D 25 HYDROXY (VIT D DEFICIENCY, FRACTURES): Vit D, 25-Hydroxy: 16.6 ng/mL — ABNORMAL LOW (ref 30.0–100.0)

## 2019-02-06 LAB — TSH: TSH: 1.83 u[IU]/mL (ref 0.450–4.500)

## 2019-02-06 MED ORDER — VITAMIN D (ERGOCALCIFEROL) 1.25 MG (50000 UNIT) PO CAPS: 50000.0000 [IU] | ORAL_CAPSULE | ORAL | 2 refills | Status: DC

## 2019-03-10 ENCOUNTER — Ambulatory Visit
Admission: RE | Admit: 2019-03-10 | Discharge: 2019-03-10 | Disposition: A | Discharge status: Home / Self Care | Payer: BC Managed Care – PPO | Source: Ambulatory Visit | Attending: Obstetrics & Gynecology | Admitting: Obstetrics & Gynecology

## 2019-03-10 ENCOUNTER — Other Ambulatory Visit: Payer: Self-pay

## 2019-03-10 DIAGNOSIS — Z1231 Encounter for screening mammogram for malignant neoplasm of breast: Secondary | ICD-10-CM | POA: Insufficient documentation

## 2019-03-10 DIAGNOSIS — Z1239 Encounter for other screening for malignant neoplasm of breast: Secondary | ICD-10-CM

## 2019-03-11 ENCOUNTER — Other Ambulatory Visit: Payer: Self-pay | Admitting: Obstetrics & Gynecology

## 2019-03-11 DIAGNOSIS — R928 Other abnormal and inconclusive findings on diagnostic imaging of breast: Secondary | ICD-10-CM

## 2019-03-11 DIAGNOSIS — N6489 Other specified disorders of breast: Secondary | ICD-10-CM

## 2019-03-23 ENCOUNTER — Ambulatory Visit
Admission: RE | Admit: 2019-03-23 | Discharge: 2019-03-23 | Disposition: A | Discharge status: Home / Self Care | Payer: BC Managed Care – PPO | Source: Ambulatory Visit | Attending: Obstetrics & Gynecology | Admitting: Obstetrics & Gynecology

## 2019-03-23 DIAGNOSIS — N6489 Other specified disorders of breast: Secondary | ICD-10-CM

## 2019-03-23 DIAGNOSIS — R928 Other abnormal and inconclusive findings on diagnostic imaging of breast: Secondary | ICD-10-CM

## 2019-04-21 ENCOUNTER — Other Ambulatory Visit: Payer: Self-pay | Admitting: Obstetrics & Gynecology

## 2019-04-21 NOTE — Telephone Encounter (Signed)
Refill one more month then go back to 1000U over the counter Vit D.  Will check level next year

## 2019-04-21 NOTE — Telephone Encounter (Signed)
Does she need labs before refill?

## 2019-12-15 IMAGING — MG DIGITAL SCREENING BILAT W/ TOMO W/ CAD
8 series · 8 of 24 positions shown · non-contrast
Comparison: Previous exam(s).

CLINICAL DATA: Screening.

EXAM:
DIGITAL SCREENING BILATERAL MAMMOGRAM WITH TOMO AND CAD

[L CC synth-2D]
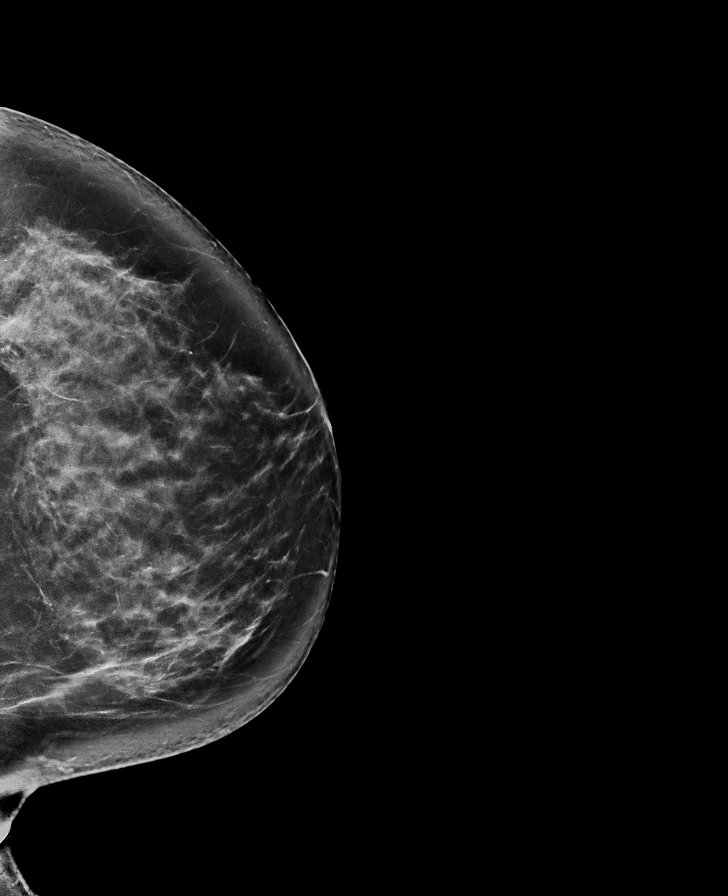

[R CC synth-2D]
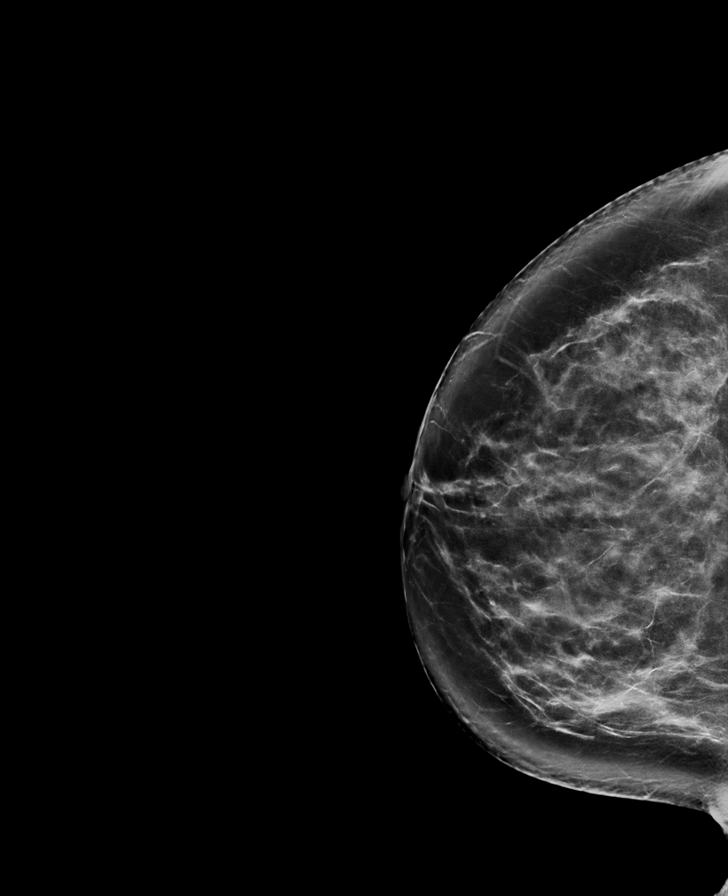

[L MLO synth-2D]
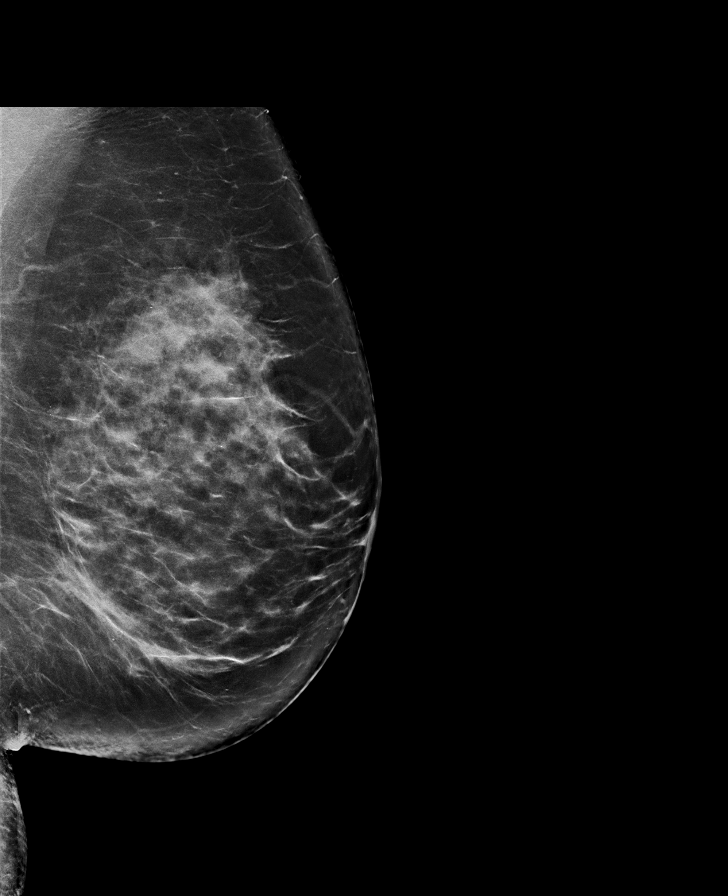

[R MLO synth-2D]
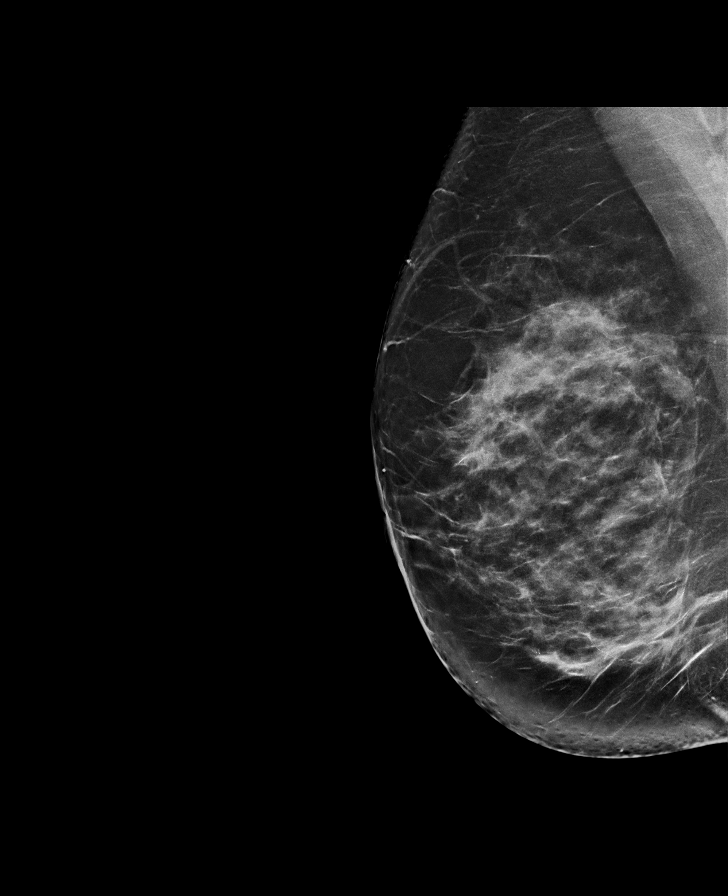

[R MLO tomo · tomo slice 48/95.0]
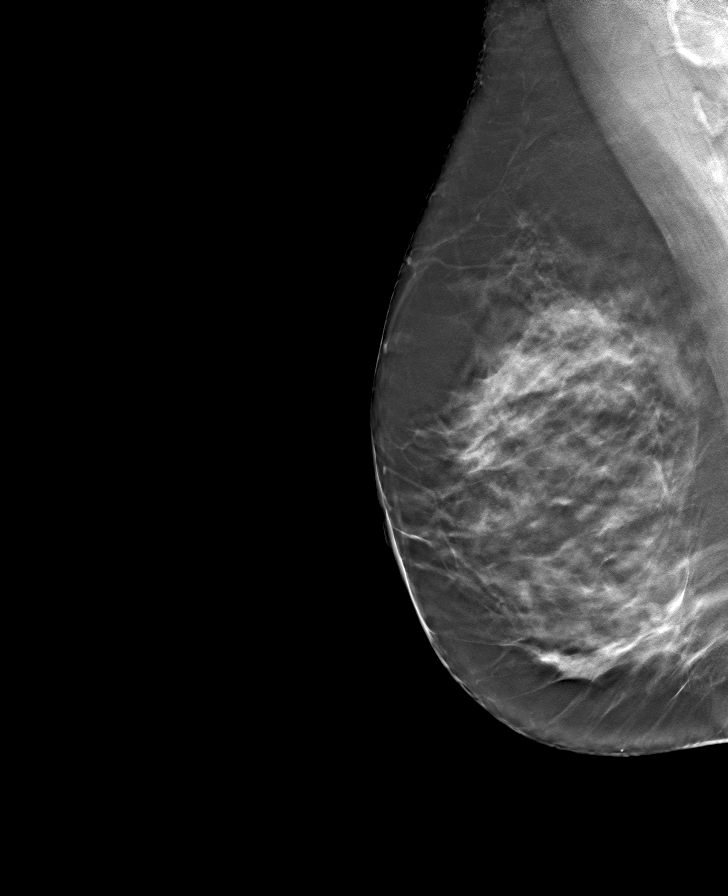

[L MLO tomo · tomo slice 49/98.0]
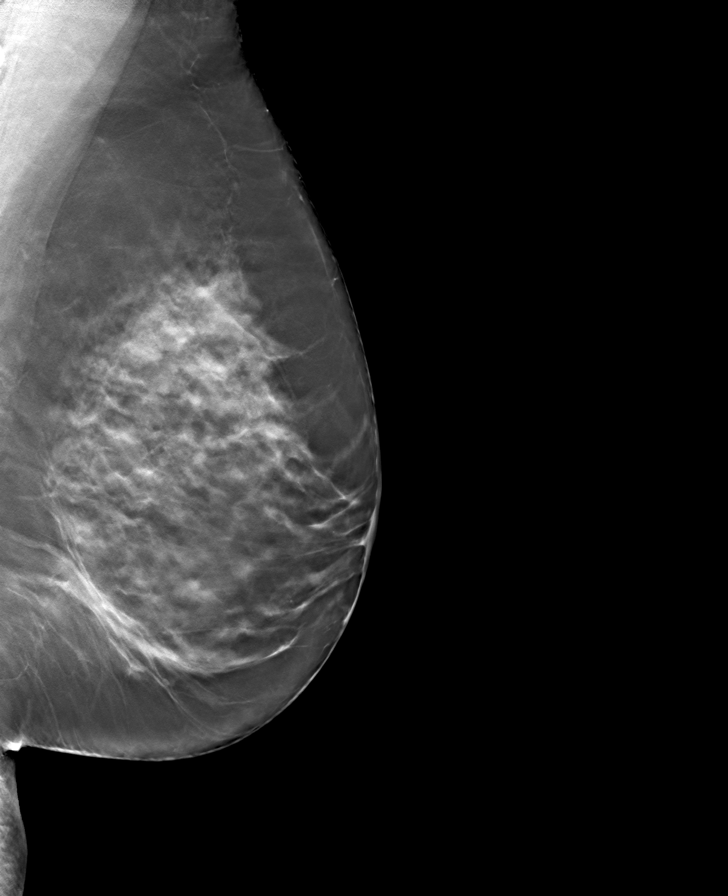

[L CC tomo · tomo slice 47/94.0]
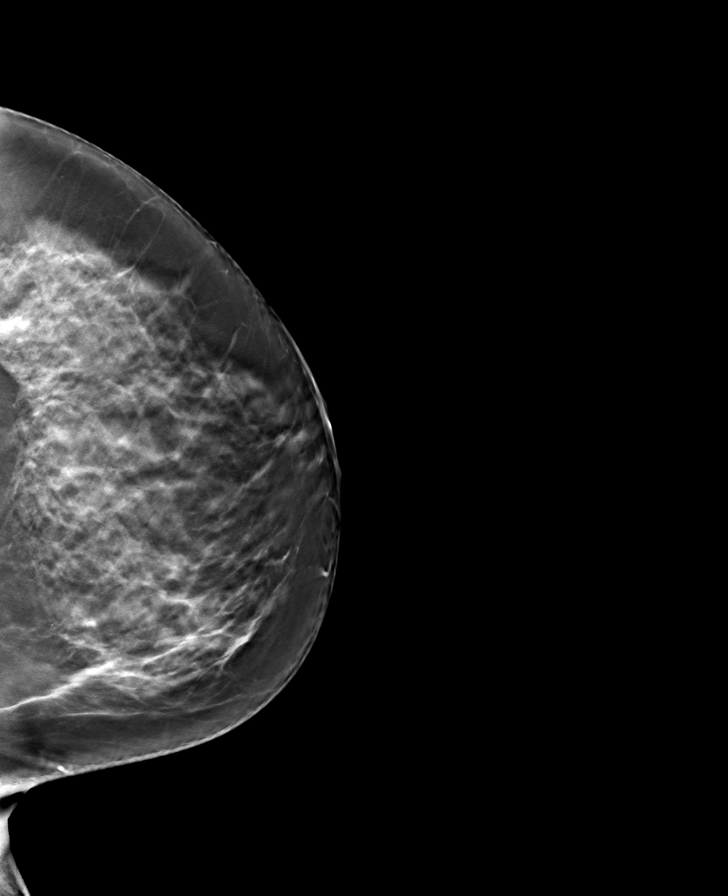

[R CC tomo · tomo slice 47/93.0]
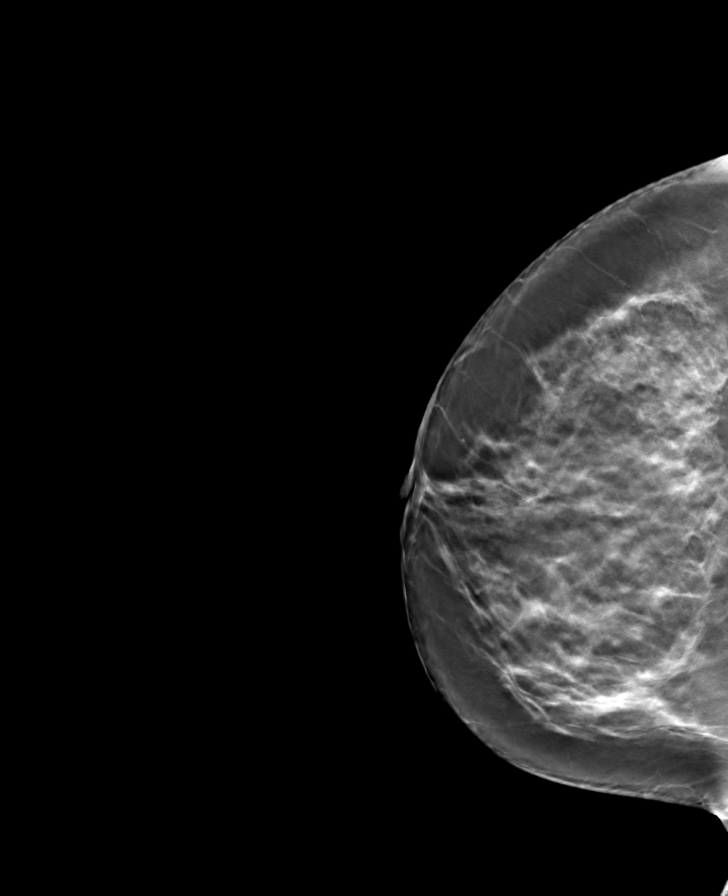

[8 of 24 positions shown; findings below may reference images not displayed]

ACR Breast Density Category c: The breast tissue is heterogeneously
dense, which may obscure small masses.
FINDINGS: In the right breast asymmetry requires further evaluation.

In the left breast focal asymmetry requires further evaluation.

Images were processed with CAD.
IMPRESSION: Further evaluation is suggested for possible asymmetry in the right
breast.

Further evaluation is suggested for possible focal asymmetry in the
left breast.

RECOMMENDATION:
Diagnostic mammogram and possibly ultrasound of both breasts.
(Code:4T-O-MML)

The patient will be contacted regarding the findings, and additional
imaging will be scheduled.

BI-RADS CATEGORY  0: Incomplete. Need additional imaging evaluation
and/or prior mammograms for comparison.

## 2020-02-03 ENCOUNTER — Other Ambulatory Visit (HOSPITAL_COMMUNITY)
Admission: RE | Admit: 2020-02-03 | Discharge: 2020-02-03 | Disposition: A | Discharge status: Home / Self Care | Payer: BC Managed Care – PPO | Source: Ambulatory Visit | Attending: Obstetrics & Gynecology | Admitting: Obstetrics & Gynecology

## 2020-02-03 ENCOUNTER — Other Ambulatory Visit: Payer: Self-pay

## 2020-02-03 ENCOUNTER — Encounter: Payer: Self-pay | Admitting: Obstetrics & Gynecology

## 2020-02-03 ENCOUNTER — Ambulatory Visit (INDEPENDENT_AMBULATORY_CARE_PROVIDER_SITE_OTHER): Payer: BC Managed Care – PPO | Admitting: Obstetrics & Gynecology

## 2020-02-03 VITALS — BP 120/80 | Ht 66.0 in | Wt 178.0 lb

## 2020-02-03 DIAGNOSIS — Z01419 Encounter for gynecological examination (general) (routine) without abnormal findings: Secondary | ICD-10-CM | POA: Diagnosis not present

## 2020-02-03 DIAGNOSIS — Z1231 Encounter for screening mammogram for malignant neoplasm of breast: Secondary | ICD-10-CM

## 2020-02-03 DIAGNOSIS — Z8639 Personal history of other endocrine, nutritional and metabolic disease: Secondary | ICD-10-CM | POA: Diagnosis not present

## 2020-02-03 DIAGNOSIS — Z87410 Personal history of cervical dysplasia: Secondary | ICD-10-CM

## 2020-02-03 DIAGNOSIS — Z1211 Encounter for screening for malignant neoplasm of colon: Secondary | ICD-10-CM

## 2020-02-03 NOTE — Progress Notes (Signed)
HPI:      Nicole Beard is a 54 y.o. [id number] who LMP was in the past (1 weeks ago, last one prior in June2021), she presents today for her annual examination.  The patient has no complaints today. The patient is sexually active. Herlast pap: approximate date 2020 and was normal and prior LGSIL 2017; and last mammogram: approximate date 2020 and was normal.  The patient does perform self breast exams.  There is no notable family history of breast or ovarian cancer in her family. The patient is not taking hormone replacement therapy.  No menopause sx's.  The patient has regular exercise: yes. The patient denies current symptoms of depression.    GYN Hx: Last Colonoscopy:3 years ago. Normal.  No covid vaccine.  Had Covid in June  PMHx: Past Medical History:  Diagnosis Date   Atypical squamous cell changes of undetermined significance (ASCUS) on cervical cytology with negative high risk human papilloma virus (HPV) test result 12/07/2015   Past Surgical History:  Procedure Laterality Date   BREAST CYST ASPIRATION Right 2006   neg   COLONOSCOPY     COLONOSCOPY WITH PROPOFOL N/A 05/12/2017   Procedure: COLONOSCOPY WITH PROPOFOL;  Surgeon: Jonathon Bellows, MD;  Location: [facility] ENDOSCOPY;  Service: Gastroenterology;  Laterality: N/A;   Family History  Problem Relation Age of Onset   Cancer Mother        colon cancer   Diabetes Father    Heart disease Father    Breast cancer Neg Hx    Ovarian cancer Neg Hx    Social History   Tobacco Use   Smoking status: Former Smoker   Smokeless tobacco: Never Used  Scientific laboratory technician Use: Never used  Substance Use Topics   Alcohol use: No   Drug use: No   No current outpatient medications on file. Allergies: Codeine and Penicillins  Review of Systems  Constitutional: Negative for chills, fever and malaise/fatigue.  HENT: Negative for congestion, sinus pain and sore throat.   Eyes: Negative for blurred vision and pain.    Respiratory: Negative for cough and wheezing.   Cardiovascular: Negative for chest pain and leg swelling.  Gastrointestinal: Negative for abdominal pain, constipation, diarrhea, heartburn, nausea and vomiting.  Genitourinary: Negative for dysuria, frequency, hematuria and urgency.  Musculoskeletal: Negative for back pain, joint pain, myalgias and neck pain.  Skin: Negative for itching and rash.  Neurological: Negative for dizziness, tremors and weakness.  Endo/Heme/Allergies: Does not bruise/bleed easily.  Psychiatric/Behavioral: Negative for depression. The patient is not nervous/anxious and does not have insomnia.     Objective: BP 120/80    Ht 5\' 6"  (1.676 m)    Wt 178 lb (80.7 kg)    LMP 01/30/2020    BMI 28.73 kg/m   Filed Weights   02/03/20 0840  Weight: 178 lb (80.7 kg)   Body mass index is 28.73 kg/m. Physical Exam Constitutional:      General: She is not in acute distress.    Appearance: She is well-developed.  Genitourinary:     Pelvic exam was performed with patient supine.     Vagina, uterus and rectum normal.     No lesions in the vagina.     No vaginal bleeding.     No cervical motion tenderness, friability, lesion or polyp.     Uterus is mobile.     Uterus is not enlarged.     No uterine mass detected.    Uterus is midaxial.  No right or left adnexal mass present.     Right adnexa not tender.     Left adnexa not tender.  HENT:     Head: Normocephalic and atraumatic. No laceration.     Right Ear: Hearing normal.     Left Ear: Hearing normal.     Mouth/Throat:     Pharynx: Uvula midline.  Eyes:     Pupils: Pupils are equal, round, and reactive to light.  Neck:     Thyroid: No thyromegaly.  Cardiovascular:     Rate and Rhythm: Normal rate and regular rhythm.     Heart sounds: No murmur heard.  No friction rub. No gallop.   Pulmonary:     Effort: Pulmonary effort is normal. No respiratory distress.     Breath sounds: Normal breath sounds. No  wheezing.  Chest:     Breasts:        Right: No mass, skin change or tenderness.        Left: No mass, skin change or tenderness.  Abdominal:     General: Bowel sounds are normal. There is no distension.     Palpations: Abdomen is soft.     Tenderness: There is no abdominal tenderness. There is no rebound.  Musculoskeletal:        General: Normal range of motion.     Cervical back: Normal range of motion and neck supple.  Neurological:     Mental Status: She is alert and oriented to person, place, and time.     Cranial Nerves: No cranial nerve deficit.  Skin:    General: Skin is warm and dry.  Psychiatric:        Judgment: Judgment normal.  Vitals reviewed.     Assessment: Annual Exam 1. Women's annual routine gynecological examination   2. History of cervical dysplasia   3. Encounter for screening mammogram for malignant neoplasm of breast   4. History of vitamin D deficiency   5. Screen for colon cancer     Plan:            1.  Cervical Screening-  Pap smear done today Prior LGSIL 2017  2. Breast screening- Exam annually and mammogram scheduled  3. Colonoscopy every 10 years, Hemoccult testing after age 44  4. Labs Ordered today  5. Counseling for hormonal therapy: none Monitor for cessation of periods, menopause sx's    F/U  Return in about 1 year (around 02/02/2021) for Annual.  Barnett Applebaum, MD, [facility], [facility] 02/03/2020  9:09 AM

## 2020-02-03 NOTE — Patient Instructions (Signed)
PAP every year Mammogram every year    Call [phone] to schedule at [facility] Colonoscopy every 10 years Labs - Vitamin D follow up  Thank you for choosing [facility]. As part of our ongoing efforts to improve patient experience, we would appreciate your feedback. Please fill out the short survey that you will receive by mail or MyChart. Your opinion is important to [place]! - Dr. Kenton Kingfisher

## 2020-02-04 LAB — CYTOLOGY - PAP: Diagnosis: NEGATIVE

## 2020-02-04 LAB — VITAMIN D 25 HYDROXY (VIT D DEFICIENCY, FRACTURES): Vit D, 25-Hydroxy: 21.6 ng/mL — ABNORMAL LOW (ref 30.0–100.0)

## 2020-03-06 LAB — FECAL OCCULT BLOOD, IMMUNOCHEMICAL

## 2020-05-09 ENCOUNTER — Telehealth: Payer: Self-pay

## 2020-05-09 NOTE — Telephone Encounter (Signed)
-----   Message from Gae Dry, MD sent at 05/08/2020 11:17 AM EST ----- Regarding: MMG Received notice she has not received MMG yet as ordered at her Annual. Please check and encourage her to do this, and document conversation.

## 2020-05-09 NOTE — Telephone Encounter (Signed)
Left message to remind pt to schedule her mammogram. 

## 2020-06-19 ENCOUNTER — Ambulatory Visit
Admission: RE | Admit: 2020-06-19 | Discharge: 2020-06-19 | Disposition: A | Discharge status: Home / Self Care | Payer: BC Managed Care – PPO | Source: Ambulatory Visit | Attending: Obstetrics & Gynecology | Admitting: Obstetrics & Gynecology

## 2020-06-19 ENCOUNTER — Other Ambulatory Visit: Payer: Self-pay

## 2020-06-19 DIAGNOSIS — Z1231 Encounter for screening mammogram for malignant neoplasm of breast: Secondary | ICD-10-CM | POA: Diagnosis not present

## 2021-04-06 ENCOUNTER — Other Ambulatory Visit: Payer: Self-pay

## 2021-04-06 ENCOUNTER — Encounter: Payer: Self-pay | Admitting: Obstetrics & Gynecology

## 2021-04-06 ENCOUNTER — Ambulatory Visit (INDEPENDENT_AMBULATORY_CARE_PROVIDER_SITE_OTHER): Payer: BC Managed Care – PPO | Admitting: Obstetrics & Gynecology

## 2021-04-06 VITALS — BP 120/80 | Ht 66.0 in | Wt 192.0 lb

## 2021-04-06 DIAGNOSIS — Z131 Encounter for screening for diabetes mellitus: Secondary | ICD-10-CM

## 2021-04-06 DIAGNOSIS — Z87410 Personal history of cervical dysplasia: Secondary | ICD-10-CM | POA: Diagnosis not present

## 2021-04-06 DIAGNOSIS — Z1329 Encounter for screening for other suspected endocrine disorder: Secondary | ICD-10-CM

## 2021-04-06 DIAGNOSIS — Z1231 Encounter for screening mammogram for malignant neoplasm of breast: Secondary | ICD-10-CM

## 2021-04-06 DIAGNOSIS — N23 Unspecified renal colic: Secondary | ICD-10-CM

## 2021-04-06 DIAGNOSIS — Z01419 Encounter for gynecological examination (general) (routine) without abnormal findings: Secondary | ICD-10-CM

## 2021-04-06 DIAGNOSIS — Z1321 Encounter for screening for nutritional disorder: Secondary | ICD-10-CM

## 2021-04-06 DIAGNOSIS — Z13220 Encounter for screening for lipoid disorders: Secondary | ICD-10-CM

## 2021-04-06 NOTE — Patient Instructions (Addendum)
PAP every three years Mammogram every year (in Feb)    Call [phone] to schedule at [facility] Colonoscopy every 5 years (due 2023) Labs - fasting soon  Thank you for choosing [facility]. As part of our ongoing efforts to improve patient experience, we would appreciate your feedback. Please fill out the short survey that you will receive by mail or MyChart. Your opinion is important to [place]! - Dr. Kenton Kingfisher  Recommendations to boost your immunity to prevent illness such as viral flu and colds, including covid19, are as follows:       - - -  Vitamin K2 and Vitamin D3  - - - Take Vitamin K2 at 200-300 mcg daily (usually 2-3 pills daily of the over the counter formulation). Take Vitamin D3 at 3000-4000 U daily (usually 3-4 pills daily of the over the counter formulation). Studies show that these two at high normal levels in your system are very effective in keeping your immunity so strong and protective that you will be unlikely to contract viral illness such as those listed above.  Dr Kenton Kingfisher

## 2021-04-06 NOTE — Progress Notes (Signed)
HPI:      [name] is a 55 y.o. [id number] who LMP was in the past, she presents today for her annual examination.  The patient has no complaints today. She has had on eperiod this past year.  Mild to no menopause sx's.  Also c/o low back pain, concerned about kidneys.  The patient is sexually active. Herlast pap: approximate date 2021 and was normal and last mammogram: approximate date 2021 and was normal.  The patient does perform self breast exams.  There is no notable family history of breast or ovarian cancer in her family. The patient is not taking hormone replacement therapy.  One period this past year.    The patient has regular exercise: yes. The patient denies current symptoms of depression.    GYN Hx: Last Colonoscopy:4 years ago. Normal.    PMHx: Past Medical History:  Diagnosis Date   Atypical squamous cell changes of undetermined significance (ASCUS) on cervical cytology with negative high risk human papilloma virus (HPV) test result 12/07/2015   Past Surgical History:  Procedure Laterality Date   BREAST CYST ASPIRATION Right 2006   neg   COLONOSCOPY     COLONOSCOPY WITH PROPOFOL N/A 05/12/2017   Procedure: COLONOSCOPY WITH PROPOFOL;  Surgeon: Jonathon Bellows, MD;  Location: [facility] ENDOSCOPY;  Service: Gastroenterology;  Laterality: N/A;   Family History  Problem Relation Age of Onset   Cancer Mother        colon cancer   Diabetes Father    Heart disease Father    Breast cancer Neg Hx    Ovarian cancer Neg Hx    Social History   Tobacco Use   Smoking status: Former   Smokeless tobacco: Never  Scientific laboratory technician Use: Never used  Substance Use Topics   Alcohol use: No   Drug use: No    Current Outpatient Medications:    ergocalciferol (VITAMIN D2) 1.25 MG (50000 UT) capsule, Take by mouth., Disp: , Rfl:  Allergies: Codeine and Penicillins  Review of Systems  Constitutional:  Negative for chills, fever and malaise/fatigue.  HENT:  Negative for  congestion, sinus pain and sore throat.   Eyes:  Negative for blurred vision and pain.  Respiratory:  Negative for cough and wheezing.   Cardiovascular:  Negative for chest pain and leg swelling.  Gastrointestinal:  Negative for abdominal pain, constipation, diarrhea, heartburn, nausea and vomiting.  Genitourinary:  Negative for dysuria, frequency, hematuria and urgency.  Musculoskeletal:  Negative for back pain, joint pain, myalgias and neck pain.  Skin:  Negative for itching and rash.  Neurological:  Negative for dizziness, tremors and weakness.  Endo/Heme/Allergies:  Does not bruise/bleed easily.  Psychiatric/Behavioral:  Negative for depression. The patient is not nervous/anxious and does not have insomnia.    Objective: BP 120/80   Ht 5\' 6"  (1.676 m)   Wt 192 lb (87.1 kg)   LMP 03/23/2021   BMI 30.99 kg/m   Filed Weights   04/06/21 1008  Weight: 192 lb (87.1 kg)   Body mass index is 30.99 kg/m. Physical Exam Constitutional:      General: She is not in acute distress.    Appearance: She is well-developed.  Genitourinary:     Bladder, rectum and urethral meatus normal.     No lesions in the vagina.     Right Labia: No rash, tenderness or lesions.    Left Labia: No tenderness, lesions or rash.    No vaginal bleeding.  Right Adnexa: not tender and no mass present.    Left Adnexa: not tender and no mass present.    No cervical motion tenderness, friability, lesion or polyp.     Uterus is not enlarged.     No uterine mass detected.    Pelvic exam was performed with patient in the lithotomy position.  Breasts:    Right: No mass, skin change or tenderness.     Left: No mass, skin change or tenderness.  HENT:     Head: Normocephalic and atraumatic. No laceration.     Right Ear: Hearing normal.     Left Ear: Hearing normal.     Mouth/Throat:     Pharynx: Uvula midline.  Eyes:     Pupils: Pupils are equal, round, and reactive to light.  Neck:     Thyroid: No  thyromegaly.  Cardiovascular:     Rate and Rhythm: Normal rate and regular rhythm.     Heart sounds: No murmur heard.   No friction rub. No gallop.  Pulmonary:     Effort: Pulmonary effort is normal. No respiratory distress.     Breath sounds: Normal breath sounds. No wheezing.  Abdominal:     General: Bowel sounds are normal. There is no distension.     Palpations: Abdomen is soft.     Tenderness: There is no abdominal tenderness. There is no rebound.  Musculoskeletal:        General: Normal range of motion.     Cervical back: Normal range of motion and neck supple.  Neurological:     Mental Status: She is alert and oriented to person, place, and time.     Cranial Nerves: No cranial nerve deficit.  Skin:    General: Skin is warm and dry.  Psychiatric:        Judgment: Judgment normal.  Vitals reviewed.    Assessment: Annual Exam 1. Women's annual routine gynecological examination   2. History of cervical dysplasia   3. Encounter for screening mammogram for malignant neoplasm of breast   4. Screening cholesterol level   5. Screening for thyroid disorder   6. Screening for diabetes mellitus   7. Encounter for vitamin deficiency screening   8. Kidney pain     Plan:            1.  Cervical Screening-  Pap smear schedule reviewed with patient, Prior LGSIL 2017; plan q 3 yrs  2. Breast screening- Exam annually and mammogram scheduled  3. Colonoscopy every 10 years, Hemoccult testing after age 73  4. Labs To return fasting at a later date  5. Counseling for hormonal therapy: none              6. Low back pain.  Labs ordered but also encouraged to see PCP.Marland Kitchen    F/U  Return in about 1 year (around 04/06/2022) for Annual, sch fasting lab appt soon.  Barnett Applebaum, MD, [facility], [facility] 04/06/2021  10:50 AM

## 2021-04-16 ENCOUNTER — Other Ambulatory Visit: Payer: Self-pay

## 2021-04-16 ENCOUNTER — Other Ambulatory Visit: Payer: BC Managed Care – PPO

## 2021-04-16 DIAGNOSIS — Z131 Encounter for screening for diabetes mellitus: Secondary | ICD-10-CM

## 2021-04-16 DIAGNOSIS — Z13220 Encounter for screening for lipoid disorders: Secondary | ICD-10-CM

## 2021-04-16 DIAGNOSIS — Z1321 Encounter for screening for nutritional disorder: Secondary | ICD-10-CM

## 2021-04-16 DIAGNOSIS — Z1329 Encounter for screening for other suspected endocrine disorder: Secondary | ICD-10-CM

## 2021-04-16 DIAGNOSIS — N23 Unspecified renal colic: Secondary | ICD-10-CM

## 2021-04-17 LAB — GLUCOSE, FASTING: Glucose, Plasma: 90 mg/dL (ref 70–99)

## 2021-04-17 LAB — COMPREHENSIVE METABOLIC PANEL
ALT: 39 IU/L — ABNORMAL HIGH (ref 0–32)
AST: 28 IU/L (ref 0–40)
Albumin/Globulin Ratio: 1.9 (ref 1.2–2.2)
Albumin: 4.2 g/dL (ref 3.8–4.9)
Alkaline Phosphatase: 117 IU/L (ref 44–121)
BUN/Creatinine Ratio: 14 (ref 9–23)
BUN: 11 mg/dL (ref 6–24)
Bilirubin Total: 0.5 mg/dL (ref 0.0–1.2)
CO2: 24 mmol/L (ref 20–29)
Calcium: 9 mg/dL (ref 8.7–10.2)
Chloride: 104 mmol/L (ref 96–106)
Creatinine, Ser: 0.78 mg/dL (ref 0.57–1.00)
Globulin, Total: 2.2 g/dL (ref 1.5–4.5)
Glucose: 92 mg/dL (ref 70–99)
Potassium: 4.6 mmol/L (ref 3.5–5.2)
Sodium: 140 mmol/L (ref 134–144)
Total Protein: 6.4 g/dL (ref 6.0–8.5)
eGFR: 90 mL/min/{1.73_m2} (ref >59)

## 2021-04-17 LAB — LIPID PANEL
Chol/HDL Ratio: 3.3 ratio (ref 0.0–4.4)
Cholesterol, Total: 204 mg/dL — ABNORMAL HIGH (ref 100–199)
HDL: 61 mg/dL (ref >39)
LDL Chol Calc (NIH): 128 mg/dL — ABNORMAL HIGH (ref 0–99)
Triglycerides: 84 mg/dL (ref 0–149)
VLDL Cholesterol Cal: 15 mg/dL (ref 5–40)

## 2021-04-17 LAB — VITAMIN D 25 HYDROXY (VIT D DEFICIENCY, FRACTURES): Vit D, 25-Hydroxy: 28.7 ng/mL — ABNORMAL LOW (ref 30.0–100.0)

## 2021-04-17 LAB — TSH: TSH: 1.16 u[IU]/mL (ref 0.450–4.500)

## 2021-07-17 ENCOUNTER — Other Ambulatory Visit: Payer: Self-pay

## 2021-07-17 ENCOUNTER — Ambulatory Visit
Admission: RE | Admit: 2021-07-17 | Discharge: 2021-07-17 | Disposition: A | Discharge status: Home / Self Care | Payer: BC Managed Care – PPO | Source: Ambulatory Visit | Attending: Obstetrics & Gynecology | Admitting: Obstetrics & Gynecology

## 2021-07-17 DIAGNOSIS — Z1231 Encounter for screening mammogram for malignant neoplasm of breast: Secondary | ICD-10-CM | POA: Insufficient documentation

## 2022-09-13 ENCOUNTER — Ambulatory Visit: Payer: BC Managed Care – PPO | Admitting: Family Medicine

## 2022-09-24 ENCOUNTER — Encounter: Payer: Self-pay | Admitting: Obstetrics & Gynecology

## 2022-09-24 ENCOUNTER — Ambulatory Visit (INDEPENDENT_AMBULATORY_CARE_PROVIDER_SITE_OTHER): Payer: BC Managed Care – PPO | Admitting: Obstetrics & Gynecology

## 2022-09-24 ENCOUNTER — Other Ambulatory Visit (HOSPITAL_COMMUNITY)
Admission: RE | Admit: 2022-09-24 | Discharge: 2022-09-24 | Disposition: A | Discharge status: Home / Self Care | Payer: BC Managed Care – PPO | Source: Ambulatory Visit | Attending: Obstetrics & Gynecology | Admitting: Obstetrics & Gynecology

## 2022-09-24 VITALS — BP 118/70 | HR 83 | Ht 66.0 in | Wt 189.0 lb

## 2022-09-24 DIAGNOSIS — Z87410 Personal history of cervical dysplasia: Secondary | ICD-10-CM

## 2022-09-24 DIAGNOSIS — E669 Obesity, unspecified: Secondary | ICD-10-CM | POA: Insufficient documentation

## 2022-09-24 DIAGNOSIS — Z01419 Encounter for gynecological examination (general) (routine) without abnormal findings: Secondary | ICD-10-CM

## 2022-09-24 DIAGNOSIS — Z800 Family history of malignant neoplasm of digestive organs: Secondary | ICD-10-CM | POA: Insufficient documentation

## 2022-09-24 DIAGNOSIS — Z124 Encounter for screening for malignant neoplasm of cervix: Secondary | ICD-10-CM | POA: Diagnosis present

## 2022-09-24 DIAGNOSIS — Z1231 Encounter for screening mammogram for malignant neoplasm of breast: Secondary | ICD-10-CM

## 2022-09-24 DIAGNOSIS — Z0000 Encounter for general adult medical examination without abnormal findings: Secondary | ICD-10-CM

## 2022-09-24 DIAGNOSIS — E559 Vitamin D deficiency, unspecified: Secondary | ICD-10-CM

## 2022-09-24 NOTE — Progress Notes (Signed)
ANNUAL PREVENTATIVE CARE GYNECOLOGY  ENCOUNTER NOTE  Subjective:       Nicole Beard is a 57 y.o. married [id number] here for a routine annual gynecologic exam. The patient is sexually active. The patient is not taking hormone replacement therapy. Patient denies post-menopausal vaginal bleeding. The patient wears seatbelts: yes. The patient participates in regular exercise: no. Has the patient ever been transfused or tattooed?: no. The patient reports that there is not domestic violence in her life.  She will be seeing her primary care provider next month. She will get her labs today so the results can be discussed then prn.  Gynecologic History No LMP recorded. (Menstrual status: Perimenopausal).  Last Pap: 2021. Results were: normal, h/o LGSIL in 2017 Last mammogram: 2023. Results were: normal Last Colonoscopy: UTD   FH- colon cancer, no breast, gyn cancer  Obstetric History OB History  Gravida Para Term Preterm AB Living  4 2 2  0 2 2  SAB IAB Ectopic Multiple Live Births  1 0 1 0 2    # Outcome Date GA Lbr Len/2nd Weight Sex Delivery Anes PTL Lv  4 Term           3 Term           2 Ectopic           1 SAB             Past Medical History:  Diagnosis Date   Atypical squamous cell changes of undetermined significance (ASCUS) on cervical cytology with negative high risk human papilloma virus (HPV) test result 12/07/2015    Family History  Problem Relation Age of Onset   Cancer Mother        colon cancer   Diabetes Father    Heart disease Father    Breast cancer Neg Hx    Ovarian cancer Neg Hx     Past Surgical History:  Procedure Laterality Date   BREAST CYST ASPIRATION Right 2006   neg   COLONOSCOPY     COLONOSCOPY WITH PROPOFOL N/A 05/12/2017   Procedure: COLONOSCOPY WITH PROPOFOL;  Surgeon: Wyline Mood, MD;  Location: [facility] ENDOSCOPY;  Service: Gastroenterology;  Laterality: N/A;    Social History   Socioeconomic History   Marital status: Married     Spouse name: Not on file   Number of children: Not on file   Years of education: Not on file   Highest education level: Not on file  Occupational History   Not on file  Tobacco Use   Smoking status: Former   Smokeless tobacco: Never  Vaping Use   Vaping Use: Never used  Substance and Sexual Activity   Alcohol use: No   Drug use: No   Sexual activity: Yes    Birth control/protection: Condom  Other Topics Concern   Not on file  Social History Narrative   Not on file   Social Determinants of Health   Financial Resource Strain: Not on file  Food Insecurity: Not on file  Transportation Needs: Not on file  Physical Activity: Not on file  Stress: Not on file  Social Connections: Not on file  Intimate Partner Violence: Not on file    Current Outpatient Medications on File Prior to Visit  Medication Sig Dispense Refill   ergocalciferol (VITAMIN D2) 1.25 MG (50000 UT) capsule Take by mouth. (Patient not taking: Reported on 09/24/2022)     No current facility-administered medications on file prior to visit.    Allergies  Allergen Reactions   Codeine Nausea And Vomiting and Other (See Comments)   Penicillins Rash      Review of Systems ROS Review of Systems - General ROS: negative for - chills, fatigue, fever, hot flashes, night sweats, weight gain or weight loss Psychological ROS: negative for - anxiety, decreased libido, depression, mood swings, physical abuse or sexual abuse Ophthalmic ROS: negative for - blurry vision, eye pain or loss of vision ENT ROS: negative for - headaches, hearing change, visual changes or vocal changes Allergy and Immunology ROS: negative for - hives, itchy/watery eyes or seasonal allergies Hematological and Lymphatic ROS: negative for - bleeding problems, bruising, swollen lymph nodes or weight loss Endocrine ROS: negative for - galactorrhea, hair pattern changes, hot flashes, malaise/lethargy, mood swings, palpitations, polydipsia/polyuria,  skin changes, temperature intolerance or unexpected weight changes Breast ROS: negative for - new or changing breast lumps or nipple discharge Respiratory ROS: negative for - cough or shortness of breath Cardiovascular ROS: negative for - chest pain, irregular heartbeat, palpitations or shortness of breath Gastrointestinal ROS: no abdominal pain, change in bowel habits, or black or bloody stools Genito-Urinary ROS: no dysuria, trouble voiding, or hematuria Musculoskeletal ROS: negative for - joint pain or joint stiffness Neurological ROS: negative for - bowel and bladder control changes Dermatological ROS: negative for rash and skin lesion changes   Objective:   BP 118/70   Pulse 83   Ht 5\' 6"  (1.676 m)   Wt 189 lb (85.7 kg)   BMI 30.51 kg/m  CONSTITUTIONAL: Well-developed, well-nourished female in no acute distress.  PSYCHIATRIC: Normal mood and affect. Normal behavior. Normal judgment and thought content. NEUROLGIC: Alert and oriented to person, place, and time. Normal muscle tone coordination. No cranial nerve deficit noted. HENT:  Normocephalic, atraumatic, External right and left ear normal. Oropharynx is clear and moist EYES: Conjunctivae and EOM are normal. Pupils are equal, round, and reactive to light. No scleral icterus.  NECK: Normal range of motion, supple, no masses.  Normal thyroid.  SKIN: Skin is warm and dry. No rash noted. Not diaphoretic. No erythema. No pallor. CARDIOVASCULAR: Normal heart rate noted, regular rhythm, no murmur. RESPIRATORY: Clear to auscultation bilaterally. Effort and breath sounds normal, no problems with respiration noted. BREASTS: Symmetric in size. No masses, skin changes, nipple drainage, or lymphadenopathy. ABDOMEN: Soft, normal bowel sounds, no distention noted.  No tenderness, rebound or guarding.  BLADDER: Normal PELVIC:  Bladder no bladder distension noted  Urethra: normal appearing urethra with no masses, tenderness or lesions  Vulva:  normal appearing vulva with no masses, tenderness or lesions  Vagina: normal appearing vagina with normal color and discharge, no lesions  Cervix: normal appearing cervix without discharge or lesions  Uterus: uterus is normal size, shape, consistency and nontender  Adnexa: no masses  RV: External Exam NormaI  MUSCULOSKELETAL: Normal range of motion. No tenderness.  No cyanosis, clubbing, or edema.  2+ distal pulses. LYMPHATIC: No Axillary, Supraclavicular, or Inguinal Adenopathy.       Assessment:   1. Well woman exam with routine gynecological exam   2. Encounter for screening mammogram for malignant neoplasm of breast   3. Screening for cervical cancer   4. History of cervical dysplasia   5. Vitamin D deficiency   6. Preventative health care   7. Obesity (BMI 30-39.9)      Plan:  Pap with HPV cotesting Mammogram ordered Preventative labs ordered Genetic testing due to her mother having colon cancer   Return to Clinic -  1 Year/as necessary sooner   Allie Bossier, MD [facility]

## 2022-09-24 NOTE — Progress Notes (Deleted)
ANNUAL PREVENTATIVE CARE GYNECOLOGY  ENCOUNTER NOTE  Subjective:       Nicole Beard is a 57 y.o. married G4P3 (11, 58, and 29 yo kids) here for a routine annual gynecologic exam. The patient is rarely sexually active. The patient is taking estrogen replacement therapy. Patient denies post-menopausal vaginal bleeding. The patient wears seatbelts: yes. The patient participates in regular exercise: yes. Has the patient ever been transfused or tattooed?: yes. The patient reports that there is not domestic violence in her life.  Current complaints: 1.  She is still experiencing lots of hot flashes. She takes 2 mg of estrogen and also takes celexa. She has tried OTC supplements without help.   2. She is requesting a referral to psychiatry so that she can have her xanax refilled.   Gynecologic History No LMP recorded. (Menstrual status: Perimenopausal).  Last Pap: 2021. Results were: normal Last mammogram: 2023. Results were: normal Last Colonoscopy: UTD  FH- sister with colon, vulvar and cervical cancer, paternal aunt with breast cancer  ROS- she had genetic testing and does not have any known abnormalities.   Obstetric History OB History  Gravida Para Term Preterm AB Living  4 2 2  0 2 2  SAB IAB Ectopic Multiple Live Births  1 0 1 0 2    # Outcome Date GA Lbr Len/2nd Weight Sex Delivery Anes PTL Lv  4 Term           3 Term           2 Ectopic           1 SAB             Past Medical History:  Diagnosis Date   Atypical squamous cell changes of undetermined significance (ASCUS) on cervical cytology with negative high risk human papilloma virus (HPV) test result 12/07/2015    Family History  Problem Relation Age of Onset   Cancer Mother        colon cancer   Diabetes Father    Heart disease Father    Breast cancer Neg Hx    Ovarian cancer Neg Hx     Past Surgical History:  Procedure Laterality Date   BREAST CYST ASPIRATION Right 2006   neg   COLONOSCOPY      COLONOSCOPY WITH PROPOFOL N/A 05/12/2017   Procedure: COLONOSCOPY WITH PROPOFOL;  Surgeon: Wyline Mood, MD;  Location: [facility] ENDOSCOPY;  Service: Gastroenterology;  Laterality: N/A;    Social History   Socioeconomic History   Marital status: Married    Spouse name: Not on file   Number of children: Not on file   Years of education: Not on file   Highest education level: Not on file  Occupational History   Not on file  Tobacco Use   Smoking status: Former   Smokeless tobacco: Never  Vaping Use   Vaping Use: Never used  Substance and Sexual Activity   Alcohol use: No   Drug use: No   Sexual activity: Yes    Birth control/protection: Condom  Other Topics Concern   Not on file  Social History Narrative   Not on file   Social Determinants of Health   Financial Resource Strain: Not on file  Food Insecurity: Not on file  Transportation Needs: Not on file  Physical Activity: Not on file  Stress: Not on file  Social Connections: Not on file  Intimate Partner Violence: Not on file    Current Outpatient Medications on File  Prior to Visit  Medication Sig Dispense Refill   ergocalciferol (VITAMIN D2) 1.25 MG (50000 UT) capsule Take by mouth. (Patient not taking: Reported on 09/24/2022)     No current facility-administered medications on file prior to visit.    Allergies  Allergen Reactions   Codeine Nausea And Vomiting and Other (See Comments)   Penicillins Rash      Review of Systems ROS Review of Systems - General ROS: negative for - chills, fatigue, fever, hot flashes, night sweats, weight gain or weight loss Psychological ROS: negative for - anxiety, decreased libido, depression, mood swings, physical abuse or sexual abuse Ophthalmic ROS: negative for - blurry vision, eye pain or loss of vision ENT ROS: negative for - headaches, hearing change, visual changes or vocal changes Allergy and Immunology ROS: negative for - hives, itchy/watery eyes or seasonal  allergies Hematological and Lymphatic ROS: negative for - bleeding problems, bruising, swollen lymph nodes or weight loss Endocrine ROS: negative for - galactorrhea, hair pattern changes, hot flashes, malaise/lethargy, mood swings, palpitations, polydipsia/polyuria, skin changes, temperature intolerance or unexpected weight changes Breast ROS: negative for - new or changing breast lumps or nipple discharge Respiratory ROS: negative for - cough or shortness of breath Cardiovascular ROS: negative for - chest pain, irregular heartbeat, palpitations or shortness of breath Gastrointestinal ROS: no abdominal pain, change in bowel habits, or black or bloody stools Genito-Urinary ROS: no dysuria, trouble voiding, or hematuria Musculoskeletal ROS: negative for - joint pain or joint stiffness Neurological ROS: negative for - bowel and bladder control changes Dermatological ROS: negative for rash and skin lesion changes   Objective:   BP 118/70   Pulse 83   Ht 5\' 6"  (1.676 m)   Wt 189 lb (85.7 kg)   BMI 30.51 kg/m  CONSTITUTIONAL: Well-developed, well-nourished female in no acute distress.  PSYCHIATRIC: Normal mood and affect. Normal behavior. Normal judgment and thought content. NEUROLGIC: Alert and oriented to person, place, and time. Normal muscle tone coordination. No cranial nerve deficit noted. HENT:  Normocephalic, atraumatic, External right and left ear normal. Oropharynx is clear and moist EYES: Conjunctivae and EOM are normal. Pupils are equal, round, and reactive to light. No scleral icterus.  NECK: Normal range of motion, supple, no masses.  Normal thyroid.  SKIN: Skin is warm and dry. No rash noted. Not diaphoretic. No erythema. No pallor. CARDIOVASCULAR: Normal heart rate noted, regular rhythm, no murmur. RESPIRATORY: Clear to auscultation bilaterally. Effort and breath sounds normal, no problems with respiration noted. BREASTS: Symmetric in size. No masses, skin changes, nipple  drainage, or lymphadenopathy. ABDOMEN: Soft, normal bowel sounds, no distention noted.  No tenderness, rebound or guarding.  BLADDER: Normal PELVIC:  Bladder no bladder distension noted  Urethra: normal appearing urethra with no masses, tenderness or lesions  Vulva: normal appearing vulva with no masses, tenderness or lesions  Vagina: normal appearing vagina with normal color and discharge, no lesions    Adnexa: no masses  RV:  external hemorrhoids noted   MUSCULOSKELETAL: Normal range of motion. No tenderness.  No cyanosis, clubbing, or edema.  2+ distal pulses. LYMPHATIC: No Axillary, Supraclavicular, or Inguinal Adenopathy.    Lab Results  Component Value Date   CREATININE 0.78 04/16/2021   BUN 11 04/16/2021   NA 140 04/16/2021   K 4.6 04/16/2021   CL 104 04/16/2021   CO2 24 04/16/2021    Lab Results  Component Value Date   ALT 39 (H) 04/16/2021   AST 28 04/16/2021   ALKPHOS  117 04/16/2021   BILITOT 0.5 04/16/2021    Lab Results  Component Value Date   CHOL 204 (H) 04/16/2021   HDL 61 04/16/2021   LDLCALC 128 (H) 04/16/2021   TRIG 84 04/16/2021   CHOLHDL 3.3 04/16/2021    Lab Results  Component Value Date   TSH 1.160 04/16/2021    No results found for: "HGBA1C"   Assessment:   1. Well woman exam with routine gynecological exam   2. Screening for cervical cancer   3. Encounter for screening mammogram for malignant neoplasm of breast   4.      Referral to psychiatry for management of anxiety  Plan:  Pap: {Blank multiple:19196::"Pap, Reflex if ASCUS","Pap Co Test","GC/CT NAAT","Not needed","Not done"} Mammogram: {Blank multiple:19196::"***","Ordered","Not Ordered","Not Indicated"} Colon Screening:  {Blank multiple:19196::"***","Ordered","Not Ordered","Not Indicated"} Labs: {Blank multiple:19196::"Lipid 1","FBS","TSH","Hemoglobin A1C","Vit D Level""***"} Routine preventative health maintenance measures emphasized: {Blank  multiple:19196::"Exercise/Diet/Weight control","Tobacco Warnings","Alcohol/Substance use risks","Stress Management","Peer Pressure Issues","Safe Sex"} COVID Vaccination status: Return to Clinic - 1 Year   Allie Bossier, MD [facility]

## 2022-09-25 LAB — COMPREHENSIVE METABOLIC PANEL
ALT: 29 IU/L (ref 0–32)
AST: 22 IU/L (ref 0–40)
Albumin/Globulin Ratio: 1.8 (ref 1.2–2.2)
Albumin: 4.6 g/dL (ref 3.8–4.9)
Alkaline Phosphatase: 130 IU/L — ABNORMAL HIGH (ref 44–121)
BUN/Creatinine Ratio: 13 (ref 9–23)
BUN: 9 mg/dL (ref 6–24)
Bilirubin Total: 0.3 mg/dL (ref 0.0–1.2)
CO2: 22 mmol/L (ref 20–29)
Calcium: 9.2 mg/dL (ref 8.7–10.2)
Chloride: 100 mmol/L (ref 96–106)
Creatinine, Ser: 0.72 mg/dL (ref 0.57–1.00)
Globulin, Total: 2.6 g/dL (ref 1.5–4.5)
Glucose: 84 mg/dL (ref 70–99)
Potassium: 4 mmol/L (ref 3.5–5.2)
Sodium: 140 mmol/L (ref 134–144)
Total Protein: 7.2 g/dL (ref 6.0–8.5)
eGFR: 98 mL/min/{1.73_m2} (ref >59)

## 2022-09-25 LAB — TSH+FREE T4
Free T4: 1.2 ng/dL (ref 0.82–1.77)
TSH: 1.1 u[IU]/mL (ref 0.450–4.500)

## 2022-09-25 LAB — LIPID PANEL
Chol/HDL Ratio: 3.5 ratio (ref 0.0–4.4)
Cholesterol, Total: 223 mg/dL — ABNORMAL HIGH (ref 100–199)
HDL: 63 mg/dL (ref >39)
LDL Chol Calc (NIH): 129 mg/dL — ABNORMAL HIGH (ref 0–99)
Triglycerides: 178 mg/dL — ABNORMAL HIGH (ref 0–149)
VLDL Cholesterol Cal: 31 mg/dL (ref 5–40)

## 2022-09-25 LAB — VITAMIN D 25 HYDROXY (VIT D DEFICIENCY, FRACTURES): Vit D, 25-Hydroxy: 20.1 ng/mL — ABNORMAL LOW (ref 30.0–100.0)

## 2022-09-25 LAB — CBC
Hematocrit: 41.3 % (ref 34.0–46.6)
Hemoglobin: 14.3 g/dL (ref 11.1–15.9)
MCH: 33.1 pg — ABNORMAL HIGH (ref 26.6–33.0)
MCHC: 34.6 g/dL (ref 31.5–35.7)
MCV: 96 fL (ref 79–97)
Platelets: 260 10*3/uL (ref 150–450)
RBC: 4.32 x10E6/uL (ref 3.77–5.28)
RDW: 12.1 % (ref 11.7–15.4)
WBC: 5.8 10*3/uL (ref 3.4–10.8)

## 2022-09-25 LAB — HEMOGLOBIN A1C
Est. average glucose Bld gHb Est-mCnc: 108 mg/dL
Hgb A1c MFr Bld: 5.4 % (ref 4.8–5.6)

## 2022-09-28 LAB — CYTOLOGY - PAP
Comment: NEGATIVE
Diagnosis: NEGATIVE
Diagnosis: REACTIVE
High risk HPV: NEGATIVE

## 2022-09-30 ENCOUNTER — Encounter: Payer: Self-pay | Admitting: Obstetrics & Gynecology

## 2022-09-30 ENCOUNTER — Other Ambulatory Visit: Payer: Self-pay | Admitting: Obstetrics & Gynecology

## 2022-09-30 DIAGNOSIS — E559 Vitamin D deficiency, unspecified: Secondary | ICD-10-CM

## 2022-09-30 MED ORDER — VITAMIN D (ERGOCALCIFEROL) 1.25 MG (50000 UNIT) PO CAPS: 50000.0000 [IU] | ORAL_CAPSULE | ORAL | 0 refills | Status: AC

## 2022-10-01 ENCOUNTER — Telehealth: Payer: Self-pay

## 2022-10-01 NOTE — Telephone Encounter (Signed)
Received fax from [facility] letting us know they had talked to patient about family history and patient had mentioned there was more history to add and that patient would contact us to update that. Called pt to update family history, no answer, LVMTRC.

## 2022-10-03 NOTE — Telephone Encounter (Signed)
Tried pt again, no answer, LVMTRC. Mychart msg sent.

## 2022-10-28 ENCOUNTER — Ambulatory Visit
Admission: RE | Admit: 2022-10-28 | Discharge: 2022-10-28 | Disposition: A | Discharge status: Home / Self Care | Payer: BC Managed Care – PPO | Source: Ambulatory Visit | Attending: Family Medicine | Admitting: Family Medicine

## 2022-10-28 ENCOUNTER — Encounter: Payer: Self-pay | Admitting: Family Medicine

## 2022-10-28 ENCOUNTER — Ambulatory Visit
Admission: RE | Admit: 2022-10-28 | Discharge: 2022-10-28 | Disposition: A | Discharge status: Home / Self Care | Payer: BC Managed Care – PPO | Attending: Family Medicine | Admitting: Family Medicine

## 2022-10-28 ENCOUNTER — Ambulatory Visit: Payer: BC Managed Care – PPO | Admitting: Family Medicine

## 2022-10-28 VITALS — BP 118/83 | HR 75 | Ht 65.0 in | Wt 150.9 lb

## 2022-10-28 DIAGNOSIS — R079 Chest pain, unspecified: Secondary | ICD-10-CM

## 2022-10-28 DIAGNOSIS — Z23 Encounter for immunization: Secondary | ICD-10-CM

## 2022-10-28 DIAGNOSIS — Z1211 Encounter for screening for malignant neoplasm of colon: Secondary | ICD-10-CM

## 2022-10-28 DIAGNOSIS — Z1231 Encounter for screening mammogram for malignant neoplasm of breast: Secondary | ICD-10-CM

## 2022-10-28 DIAGNOSIS — Z8639 Personal history of other endocrine, nutritional and metabolic disease: Secondary | ICD-10-CM

## 2022-10-28 DIAGNOSIS — Z7689 Persons encountering health services in other specified circumstances: Secondary | ICD-10-CM

## 2022-10-28 DIAGNOSIS — Z800 Family history of malignant neoplasm of digestive organs: Secondary | ICD-10-CM

## 2022-10-28 NOTE — Assessment & Plan Note (Signed)
Chronic  Last level in April 2024 was below goal of 30 at 20  Recommended continuing weekly 50,000IU dosing

## 2022-10-28 NOTE — Assessment & Plan Note (Signed)
Colon cancer screening GI referral for colonoscopy submitted    

## 2022-10-28 NOTE — Patient Instructions (Addendum)
A referral has been placed on your behalf for cardiology. Our referral coordination team or the office you will be visiting will contact you within the next 2 weeks.  If you have not received a phone call within 10 business days please let us know so that we can check into this for you.   [facility] at [facility] [address] [place],  [place]  [address] Main: [phone]  Please report to [facility] located at:  [address]  [place], [place] [address]  You do not need an appointment to have xrays completed.   Our office will follow up with  results once available.    Preventing High Cholesterol Cholesterol is a white, waxy substance similar to fat that the human body needs to help build cells. The liver makes all the cholesterol that a person's body needs. Having high cholesterol (hypercholesterolemia) increases your risk for heart disease and stroke. Extra or excess cholesterol comes from the food that you eat. High cholesterol can often be prevented with diet and lifestyle changes. If you already have high cholesterol, you can control it with diet, lifestyle changes, and medicines. How can high cholesterol affect me? If you have high cholesterol, fatty deposits (plaques) may build up on the walls of your blood vessels. The blood vessels that carry blood away from your heart are called arteries. Plaques make the arteries narrower and stiffer. This in turn can: Restrict or block blood flow and cause blood clots to form. Increase your risk for heart attack and stroke. What can increase my risk for high cholesterol? This condition is more likely to develop in people who: Eat foods that are high in saturated fat or cholesterol. Saturated fat is mostly found in foods that come from animal sources. Are overweight. Are not getting enough exercise. Use products that contain nicotine or tobacco, such as cigarettes,  e-cigarettes, and chewing tobacco. Have a family history of high cholesterol (familial hypercholesterolemia). What actions can I take to prevent this? Nutrition  Eat less saturated fat. Avoid trans fats (partially hydrogenated oils). These are often found in margarine and in some baked goods, fried foods, and snacks bought in packages. Avoid precooked or cured meat, such as bacon, sausages, or meat loaves. Avoid foods and drinks that have added sugars. Eat more fruits, vegetables, and whole grains. Choose healthy sources of protein, such as fish, poultry, lean cuts of red meat, beans, peas, lentils, and nuts. Choose healthy sources of fat, such as: Nuts. Vegetable oils, especially olive oil. Fish that have healthy fats, such as omega-3 fatty acids. These fish include mackerel or salmon. Lifestyle Lose weight if you are overweight. Maintaining a healthy body mass index (BMI) can help prevent or control high cholesterol. It can also lower your risk for diabetes and high blood pressure. Ask your health care provider to help you with a diet and exercise plan to lose weight safely. Do not use any products that contain nicotine or tobacco. These products include cigarettes, chewing tobacco, and vaping devices, such as e-cigarettes. If you need help quitting, ask your health care provider. Alcohol use Do not drink alcohol if: Your health care provider tells you not to drink. You are pregnant, may be pregnant, or are planning to become pregnant. If you drink alcohol: Limit how much you have to: 0-1 drink a day for women. 0-2 drinks a day for men. Know how much alcohol is in your drink. In the [place] one drink  equals one 12 oz bottle of beer (355 mL), one 5 oz glass of wine (148 mL), or one 1 oz glass of hard liquor (44 mL). Activity  Get enough exercise. Do exercises as told by your health care provider. Each week, do at least 150 minutes of exercise that takes a medium level of effort  (moderate-intensity exercise). This kind of exercise: Makes your heart beat faster while allowing you to still be able to talk. Can be done in short sessions several times a day or longer sessions a few times a week. For example, on 5 days each week, you could walk fast or ride your bike 3 times a day for 10 minutes each time. Medicines Your health care provider may recommend medicines to help lower cholesterol. This may be a medicine to lower the amount of cholesterol that your liver makes. You may need medicine if: Diet and lifestyle changes have not lowered your cholesterol enough. You have high cholesterol and other risk factors for heart disease or stroke. Take over-the-counter and prescription medicines only as told by your health care provider. General information Manage your risk factors for high cholesterol. Talk with your health care provider about all your risk factors and how to lower your risk. Manage other conditions that you have, such as diabetes or high blood pressure (hypertension). Have blood tests to check your cholesterol levels at regular points in time as told by your health care provider. Keep all follow-up visits. This is important. Where to find more information American Heart Association: www.heart.org National Heart, Lung, and Blood Institute: PopSteam.is Summary High cholesterol increases your risk for heart disease and stroke. By keeping your cholesterol level low, you can reduce your risk for these conditions. High cholesterol can often be prevented with diet and lifestyle changes. Work with your health care provider to manage your risk factors, and have your blood tested regularly. This information is not intended to replace advice given to you by your health care provider. Make sure you discuss any questions you have with your health care provider. Document Revised: 12/14/2021 Document Reviewed: 07/17/2020 Elsevier Patient Education  2024 Elsevier  Inc.    Vitamin D Deficiency Vitamin D deficiency is when your body does not have enough vitamin D. Vitamin D is important because: It helps your body use certain minerals. It helps to keep your bones healthy. It lessens irritation and swelling (inflammation). It helps the body's defense system (immune system) work better. Not getting enough vitamin D can make your bones soft. What are the causes? Not eating enough foods that have vitamin D in them. Not getting enough sun. Having diseases that make it hard for your body to take in vitamin D. Having had part of your stomach or part of your small intestine taken out. What increases the risk? Being an older adult. Not spending much time outdoors. Living in a long-term care center. Having dark skin. Taking certain medicines. Being overweight or very overweight (obese). Having long-term (chronic) kidney or liver disease. What are the signs or symptoms? In mild cases, there may be no symptoms. If the condition is very bad, symptoms may include: Bone pain. Muscle pain. Not being able to walk normally. Bones that break easily. Joint pain. How is this treated? Treatment may include taking supplements as told by your doctor. Your doctor will tell you what dose is best for you. This may include taking: Vitamin D. Calcium. Follow these instructions at home: Eating and drinking Eat foods that have vitamin D  in them, such as: Dairy products, cereals, or juices that have vitamin D added to them (are fortified). Check the label. Fish, such as salmon or trout. Eggs. The vitamin D is in the yolk. Mushrooms that were treated with UV light. Beef liver. The items listed above may not be a complete list of foods and beverages you can eat and drink. Contact a dietitian for more information. General instructions Take over-the-counter and prescription medicines only as told by your doctor. Take supplements only as told by your doctor. Get  sunlight in a safe way. Do not use a tanning bed. Stay at a healthy weight. Lose weight if you need to. Keep all follow-up visits. How is this prevented? Eating foods that naturally have vitamin D in them. Eating or drinking foods and drinks that have vitamin D added to them, such as cereals, juices, and milk. Taking vitamin D or a multivitamin that has vitamin D in it. Being in the sun. Your body makes vitamin D when your skin gets sunlight. Contact a doctor if: Your symptoms do not go away. You feel like you may vomit (nauseous). You vomit. You poop less often than normal, or you have trouble pooping (constipation). Summary Vitamin D deficiency is when your body does not have enough vitamin D. Vitamin D helps to keep your bones healthy. This condition is often treated by taking a supplement. Your doctor will tell you what dose is best for you. This information is not intended to replace advice given to you by your health care provider. Make sure you discuss any questions you have with your health care provider. Document Revised: 02/16/2021 Document Reviewed: 02/16/2021 Elsevier Patient Education  2024 [employer].

## 2022-10-28 NOTE — Assessment & Plan Note (Signed)
Welcomed patient to [facility]  Reviewed patient's medical history, medications, surgical and social history Discussed roles and expectations for primary care physician-patient relationship Recommended patient schedule annual preventative examinations   

## 2022-10-28 NOTE — Assessment & Plan Note (Signed)
Chronic  Has been present for years however pain sometimes associated with taking deep breath  I have low suspicion of chronic PE given normal vital signs and that this has been present for several years, we discussed potential etiology of cardiac ischemia as well as costochondritis although I would expect this to have improved or have intermittent resolution  Given her age, hx of smoking and elevated lipids, recommended cardiac evaluation, referral to cardiology submitted  Also will order CXR to look for any structural abnormalities  Reviewed last CMP which was normal  ECG in clinic did not show signs of arrhythmia nor ischemia Patient will follow up with me in 2 weeks
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
## 2022-11-13 ENCOUNTER — Encounter: Payer: Self-pay | Admitting: *Deleted

## 2022-11-18 ENCOUNTER — Telehealth: Payer: Self-pay | Admitting: *Deleted

## 2022-11-18 ENCOUNTER — Other Ambulatory Visit: Payer: Self-pay | Admitting: *Deleted

## 2022-11-18 DIAGNOSIS — Z86010 Personal history of colonic polyps: Secondary | ICD-10-CM

## 2022-11-18 DIAGNOSIS — Z800 Family history of malignant neoplasm of digestive organs: Secondary | ICD-10-CM

## 2022-11-18 MED ORDER — NA SULFATE-K SULFATE-MG SULF 17.5-3.13-1.6 GM/177ML PO SOLN: 1.0000 | Freq: Once | ORAL | 0 refills | Status: AC

## 2022-11-18 NOTE — Telephone Encounter (Signed)
Gastroenterology Pre-Procedure Review  Request Date: 01/31/2023 Requesting Physician: Dr. Tobi Bastos  PATIENT REVIEW QUESTIONS: The patient responded to the following health history questions as indicated:    1. Are you having any GI issues? no 2. Do you have a personal history of Polyps? yes (05/12/2017) 3. Do you have a family history of Colon Cancer or Polyps? yes (mom had colon cancer) 4. Diabetes Mellitus? no 5. Joint replacements in the past 12 months?no 6. Major health problems in the past 3 months?no 7. Any artificial heart valves, MVP, or defibrillator?no    MEDICATIONS & ALLERGIES:    Patient reports the following regarding taking any anticoagulation/antiplatelet therapy:   Plavix, Coumadin, Eliquis, Xarelto, Lovenox, Pradaxa, Brilinta, or Effient? no Aspirin? no  Patient confirms/reports the following medications:  Current Outpatient Medications  Medication Sig Dispense Refill   ergocalciferol (VITAMIN D2) 1.25 MG (50000 UT) capsule Take by mouth. (Patient not taking: Reported on 09/24/2022)     Vitamin D, Ergocalciferol, (DRISDOL) 1.25 MG (50000 UNIT) CAPS capsule Take 1 capsule (50,000 Units total) by mouth every 7 (seven) days. 10 capsule 0   No current facility-administered medications for this visit.    Patient confirms/reports the following allergies:  Allergies  Allergen Reactions   Codeine Nausea And Vomiting and Other (See Comments)   Penicillins Rash    No orders of the defined types were placed in this encounter.   AUTHORIZATION INFORMATION Primary Insurance: 1D#: Group #:  Secondary Insurance: 1D#: Group #:  SCHEDULE INFORMATION: Date: 01/31/2023 Time: Location:  [facility]

## 2022-11-22 ENCOUNTER — Ambulatory Visit: Payer: BC Managed Care – PPO | Admitting: Family Medicine

## 2022-11-29 ENCOUNTER — Ambulatory Visit
Admission: RE | Admit: 2022-11-29 | Discharge: 2022-11-29 | Disposition: A | Discharge status: Home / Self Care | Payer: BC Managed Care – PPO | Source: Ambulatory Visit | Attending: Family Medicine | Admitting: Family Medicine

## 2022-11-29 DIAGNOSIS — Z1231 Encounter for screening mammogram for malignant neoplasm of breast: Secondary | ICD-10-CM | POA: Insufficient documentation

## 2022-12-11 ENCOUNTER — Ambulatory Visit: Payer: BC Managed Care – PPO | Admitting: Family Medicine

## 2023-01-09 ENCOUNTER — Encounter: Payer: Self-pay | Admitting: Cardiology

## 2023-01-09 ENCOUNTER — Ambulatory Visit: Payer: BC Managed Care – PPO | Admitting: Cardiology

## 2023-01-09 VITALS — BP 138/84 | HR 90 | Ht 66.0 in | Wt 191.6 lb

## 2023-01-09 DIAGNOSIS — E669 Obesity, unspecified: Secondary | ICD-10-CM | POA: Diagnosis not present

## 2023-01-09 DIAGNOSIS — R079 Chest pain, unspecified: Secondary | ICD-10-CM

## 2023-01-09 DIAGNOSIS — E785 Hyperlipidemia, unspecified: Secondary | ICD-10-CM | POA: Diagnosis not present

## 2023-01-09 NOTE — Progress Notes (Signed)
You berry is   Primary Care Provider: Ronnald Ramp, MD [facility] Cardiologist: None Electrophysiologist: None  Clinic Note: Chief Complaint  Patient presents with   New Patient (Initial Visit)    Referred for cardiac evaluation of chest pains.  Patient reports intermittent mid chest pain that are becoming more frequent especially with deep breaths.  Not sure if could be related to new know on left shoulder.     ===================================  ASSESSMENT/PLAN   Problem List Items Addressed This Visit     Obesity (BMI 30-39.9) (Chronic)    Obesity and hyperlipidemia are 2 components of metabolic syndrome.  Reasonable to get a baseline cardiovascular risk, however with active symptoms, would warrant ischemic evaluation first.  If GXT is negative, plan would be for coronary calcium score assessment.      Dyslipidemia, goal to be determined    Lipids are pretty high.  Need to determine baseline risk once we determine if her current symptoms are related to ischemic CAD.  If GXT is negative-check Coronary Calcium Score.  If positive, would then be more intensive with ischemic evaluation using a coronary CTA or cardiac catheterization in which case would probably start statin.      Chest pain at rest - Primary    Resting chest pain with some reproducibility on exam.  Chest x-ray was normal.  Still having some discomfort off and on.  Does not sound like it is cardiac in nature unless it would be spasm related.  Need to confirm that is nonischemic => plan: GXT  Low likelihood of current symptoms be related to CAD, however would still want to assess baseline risk.  With negative GXT, would recommend Coronary Calcium Score.  If GXT is positive, would consider Coronary CTA versus.      Relevant Orders   EKG 12-Lead (Completed)   Cardiac Stress Test: Informed Consent Details: Physician/Practitioner Attestation; Transcribe to consent form and obtain patient  signature   EXERCISE TOLERANCE TEST (ETT)   ===================================  HPI:    Nicole Beard is a 57 y.o. female former smoker (greater than 30 years ago) with PMH notable for HLD and ASCUS as well as seasonal allergies who is being seen today for the evaluation of CHEST pain at the request of Simmons-Robinson, Makie*.  JOLE BRANNAM was seen on October 28, 2022 by Dr. Neita Garnet at [facility] to establish care with PCP.  Noted having some fatigue levels.  She also noted symptoms having chest pain, as well as back pain and neck pain.  She notes having intermittent episodes of grabbing chest pain in the left upper side of her chest.  Often worse with taking a deep breath.  Referred to cardiology.  Chest x-ray normal  Recent Hospitalizations: none  Reviewed  CV studies:    The following studies were reviewed today: (if available, images/films reviewed: From Epic Chart or Care Everywhere) N/a  Interval History:   Nicole Beard -- more sedentary job.   L upper back knot - muscle spasm. Central Chest discomfort when taking deep breath - feels like a clenching sensation & has to stop the breath. Notes more with sitting more with current job.  Not noted with exertion or activity.  Can be with certain movements - lying down.  No associated SOB / DOE, nausea or diaphoresis. Spells are short-lived < 1 min.  May have unrelated short palpitations flutters - not concerning.   Mild end of day edema - goes down at end  of day.    CV Review of Symptoms (Summary):  positive for - chest pain, palpitations, and besides bein out of shape with exercise intolerance - ok. negative for - dyspnea on exertion, edema, irregular heartbeat, loss of consciousness, orthopnea, paroxysmal nocturnal dyspnea, rapid heart rate, shortness of breath, or syncope/near syncope; claudication  REVIEWED OF SYSTEMS   Review of Systems  Constitutional:  Negative for malaise/fatigue and  weight loss.  Respiratory:  Negative for shortness of breath.   Cardiovascular:  Positive for leg swelling (end of day).  Gastrointestinal:  Negative for blood in stool and melena.  Genitourinary:  Negative for hematuria.  Musculoskeletal:  Positive for back pain and joint pain.       MSK related   Neurological:  Negative for dizziness.  Psychiatric/Behavioral:  Negative for depression and memory loss. The patient is not nervous/anxious.    I have reviewed and (if needed) personally updated the patient's problem list, medications, allergies, past medical and surgical history, social and family history.   PAST MEDICAL HISTORY   Past Medical History:  Diagnosis Date   Allergy    Atypical squamous cell changes of undetermined significance (ASCUS) on cervical cytology with negative high risk human papilloma virus (HPV) test result 12/07/2015   PAST SURGICAL HISTORY   Past Surgical History:  Procedure Laterality Date   BREAST CYST ASPIRATION Right 2006   neg   COLONOSCOPY     COLONOSCOPY WITH PROPOFOL N/A 05/12/2017   Procedure: COLONOSCOPY WITH PROPOFOL;  Surgeon: Wyline Mood, MD;  Location: [facility] ENDOSCOPY;  Service: Gastroenterology;  Laterality: N/A;    Immunization History  Administered Date(s) Administered   Tdap 04/26/2010, 10/28/2022   Zoster Recombinant(Shingrix) 11/11/2021, 02/28/2022    MEDICATIONS/ALLERGIES   Current Meds  Medication Sig   Vitamin D, Ergocalciferol, (DRISDOL) 1.25 MG (50000 UNIT) CAPS capsule Take 1 capsule (50,000 Units total) by mouth every 7 (seven) days.    Allergies  Allergen Reactions   Codeine Nausea And Vomiting and Other (See Comments)   Penicillins Rash    SOCIAL HISTORY/FAMILY HISTORY   Reviewed in Epic:   Social History   Tobacco Use   Smoking status: Former    Types: Cigarettes   Smokeless tobacco: Never   Tobacco comments:    Patient reports she stopped smoking 30 years ago  Vaping Use   Vaping status: Never Used   Substance Use Topics   Alcohol use: No   Drug use: No  Social Smoker in her 84s   Social History   Social History Narrative   Not on file   Family History  Problem Relation Age of Onset   Colon cancer Mother 61 - 63   Hypertension Father    Diabetes Father    Heart disease Father 91 - 59   Heart disease Maternal Grandmother 6 - 69   Heart attack Paternal Grandmother    Breast cancer Neg Hx    Ovarian cancer Neg Hx   F - HTN, DM-2; CAD-CABG - died 5 (multiple issues) PGM - CHF, CAD-MI  OBJCTIVE -PE, EKG, labs   Wt Readings from Last 3 Encounters:  01/09/23 191 lb 9.6 oz (86.9 kg)  10/28/22 150 lb 14.4 oz (68.4 kg)  09/24/22 189 lb (85.7 kg)    Physical Exam: BP 138/84 (BP Location: Right Arm, Patient Position: Sitting, Cuff Size: Normal)   Pulse 90   Ht 5\' 6"  (1.676 m)   Wt 191 lb 9.6 oz (86.9 kg)   SpO2 98%   BMI  30.93 kg/m  Physical Exam Vitals reviewed.  Constitutional:      General: She is not in acute distress.    Appearance: She is obese. She is not ill-appearing or toxic-appearing.     Comments: Well-nourished Well-groomed.  HENT:     Head: Normocephalic and atraumatic.  Neck:     Vascular: No carotid bruit.  Cardiovascular:     Rate and Rhythm: Normal rate and regular rhythm.     Pulses: Normal pulses.     Heart sounds: Normal heart sounds. No murmur heard.    No friction rub. No gallop.  Pulmonary:     Effort: Pulmonary effort is normal.     Breath sounds: Normal breath sounds. No wheezing, rhonchi or rales.  Chest:     Chest wall: Tenderness (Mild reproducible left sternal border tenderness) present.  Abdominal:     General: Abdomen is flat. Bowel sounds are normal. There is no distension.     Palpations: Abdomen is soft. There is no mass (No HSM or bruit).     Tenderness: There is no abdominal tenderness. There is no guarding or rebound.  Musculoskeletal:        General: No swelling. Normal range of motion.     Cervical back: Normal range  of motion and neck supple.  Skin:    General: Skin is warm and dry.  Neurological:     General: No focal deficit present.     Mental Status: She is alert and oriented to person, place, and time.     Gait: Gait normal.  Psychiatric:        Mood and Affect: Mood normal.        Judgment: Judgment normal.     Adult ECG Report EKG Interpretation Date/Time:  Thursday January 09 2023 08:23:50 EDT Ventricular Rate:  90 PR Interval:  142 QRS Duration:  74 QT Interval:  346 QTC Calculation: 423 R Axis:   28  Text Interpretation: Normal sinus rhythm Low voltage QRS No previous ECGs available Confirmed by Bryan Lemma ([id number]) on 01/09/2023 8:51:12 AM    Recent Labs:  reviewed  Lab Results  Component Value Date   CHOL 223 (H) 09/24/2022   HDL 63 09/24/2022   LDLCALC 129 (H) 09/24/2022   TRIG 178 (H) 09/24/2022   CHOLHDL 3.5 09/24/2022   Lab Results  Component Value Date   NA 140 09/24/2022   CL 100 09/24/2022   K 4.0 09/24/2022   CO2 22 09/24/2022   BUN 9 09/24/2022   CREATININE 0.72 09/24/2022   EGFR 98 09/24/2022   CALCIUM 9.2 09/24/2022   ALBUMIN 4.6 09/24/2022   GLUCOSE 84 09/24/2022      Latest Ref Rng & Units 09/24/2022    3:57 PM  CBC  WBC 3.4 - 10.8 x10E3/uL 5.8   Hemoglobin 11.1 - 15.9 g/dL 46.9   Hematocrit 62.9 - 46.6 % 41.3   Platelets 150 - 450 x10E3/uL 260     Lab Results  Component Value Date   HGBA1C 5.4 09/24/2022   Lab Results  Component Value Date   TSH 1.100 09/24/2022    ================================================== I spent a total of 19 minutes with the patient spent in direct patient consultation.  Additional time spent with chart review  / charting (studies, outside notes, etc): 16 min Total Time: 35 min  Current medicines are reviewed at length with the patient today.  (+/- concerns) N/A  Notice: This dictation was prepared with Dragon dictation along with smart phrase technology.  Any transcriptional errors that result from this  process are unintentional and may not be corrected upon review.   Studies Ordered:  Orders Placed This Encounter  Procedures   Cardiac Stress Test: Informed Consent Details: Physician/Practitioner Attestation; Transcribe to consent form and obtain patient signature   EXERCISE TOLERANCE TEST (ETT)   EKG 12-Lead   No orders of the defined types were placed in this encounter.  Informed Consent   Shared Decision Making/Informed Consent{The risks [chest pain, shortness of breath, cardiac arrhythmias, dizziness, blood pressure fluctuations, myocardial infarction, stroke/transient ischemic attack, and life-threatening complications (estimated to be 1 in 10,000)], benefits (risk stratification, diagnosing coronary artery disease, treatment guidance) and alternatives of an exercise tolerance test were discussed in detail with Ms. Hagan and she agrees to proceed.      Patient Instructions / Medication Changes & Studies & Tests Ordered   Patient Instructions  Medication Instructions:  Your physician recommends that you continue on your current medications as directed. Please refer to the Current Medication list given to you today.   *If you need a refill on your cardiac medications before your next appointment, please call your pharmacy*   Lab Work: none If you have labs (blood work) drawn today and your tests are completely normal, you will receive your results only by: MyChart Message (if you have MyChart) OR A paper copy in the mail If you have any lab test that is abnormal or we need to change your treatment, we will call you to review the results.   Testing/Procedures:  Your provider has ordered a exercise tolerance test. This test will evaluate the blood supply to your heart muscle during periods of exercise and rest. For this test, you will raise your heart rate by walking on a treadmill at different levels.   you may eat a light breakfast/ lunch prior to your procedure no  caffeine for 24 hours prior to your test (coffee, tea, soft drinks, or chocolate)  no smoking/ vaping for 4 hours prior to your test you may take your regular medications the day of your test bring any inhalers with you to your test wear comfortable clothing & tennis/ non-skid shoes to walk on the treadmill  This will take place at [address], [place] [address]    Follow-Up: At [facility], you and your health needs are our priority.  As part of our continuing mission to provide you with exceptional heart care, we have created designated Provider Care Teams.  These Care Teams include your primary Cardiologist (physician) and Advanced Practice Providers (APPs -  Physician Assistants and Nurse Practitioners) who all work together to provide you with the care you need, when you need it.  We recommend signing up for the patient portal called "MyChart".  Sign up information is provided on this After Visit Summary.  MyChart is used to connect with patients for Virtual Visits (Telemedicine).  Patients are able to view lab/test results, encounter notes, upcoming appointments, etc.  Non-urgent messages can be sent to your provider as well.   To learn more about what you can do with MyChart, go to [email or web address].    Your next appointment:   Follow up after test   Provider:   Bryan Lemma, MD or Eula Listen, PA-C     Marykay Lex, MD, MS Bryan Lemma, M.D., M.S. Interventional Cardiologist  [facility] Office   [address] [place], [place]  [address] [phone]  [phone]           Fax [phone]    Thank you for choosing [facility] in [place]!!

## 2023-01-09 NOTE — Patient Instructions (Signed)
Medication Instructions:  Your physician recommends that you continue on your current medications as directed. Please refer to the Current Medication list given to you today.   *If you need a refill on your cardiac medications before your next appointment, please call your pharmacy*   Lab Work: none If you have labs (blood work) drawn today and your tests are completely normal, you will receive your results only by: MyChart Message (if you have MyChart) OR A paper copy in the mail If you have any lab test that is abnormal or we need to change your treatment, we will call you to review the results.   Testing/Procedures:  Your provider has ordered a exercise tolerance test. This test will evaluate the blood supply to your heart muscle during periods of exercise and rest. For this test, you will raise your heart rate by walking on a treadmill at different levels.   you may eat a light breakfast/ lunch prior to your procedure no caffeine for 24 hours prior to your test (coffee, tea, soft drinks, or chocolate)  no smoking/ vaping for 4 hours prior to your test you may take your regular medications the day of your test bring any inhalers with you to your test wear comfortable clothing & tennis/ non-skid shoes to walk on the treadmill  This will take place at [address] [address], [place] [address]    Follow-Up: At [facility], you and your health needs are our priority.  As part of our continuing mission to provide you with exceptional heart care, we have created designated Provider Care Teams.  These Care Teams include your primary Cardiologist (physician) and Advanced Practice Providers (APPs -  Physician Assistants and Nurse Practitioners) who all work together to provide you with the care you need, when you need it.  We recommend signing up for the patient portal called "MyChart".  Sign up information is provided on this After Visit Summary.   MyChart is used to connect with patients for Virtual Visits (Telemedicine).  Patients are able to view lab/test results, encounter notes, upcoming appointments, etc.  Non-urgent messages can be sent to your provider as well.   To learn more about what you can do with MyChart, go to [email or web address].    Your next appointment:   Follow up after test   Provider:   Bryan Lemma, MD or Eula Listen, PA-C

## 2023-01-11 ENCOUNTER — Encounter: Payer: Self-pay | Admitting: Cardiology

## 2023-01-11 DIAGNOSIS — E785 Hyperlipidemia, unspecified: Secondary | ICD-10-CM | POA: Insufficient documentation

## 2023-01-11 NOTE — Assessment & Plan Note (Signed)
Lipids are pretty high.  Need to determine baseline risk once we determine if her current symptoms are related to ischemic CAD.  If GXT is negative-check Coronary Calcium Score.  If positive, would then be more intensive with ischemic evaluation using a coronary CTA or cardiac catheterization in which case would probably start statin.

## 2023-01-11 NOTE — Assessment & Plan Note (Signed)
Obesity and hyperlipidemia are 2 components of metabolic syndrome.  Reasonable to get a baseline cardiovascular risk, however with active symptoms, would warrant ischemic evaluation first.  If GXT is negative, plan would be for coronary calcium score assessment.

## 2023-01-11 NOTE — Assessment & Plan Note (Signed)
Resting chest pain with some reproducibility on exam.  Chest x-ray was normal.  Still having some discomfort off and on.  Does not sound like it is cardiac in nature unless it would be spasm related.  Need to confirm that is nonischemic => plan: GXT  Low likelihood of current symptoms be related to CAD, however would still want to assess baseline risk.  With negative GXT, would recommend Coronary Calcium Score.  If GXT is positive, would consider Coronary CTA versus.

## 2023-01-21 ENCOUNTER — Ambulatory Visit
Admission: RE | Admit: 2023-01-21 | Discharge: 2023-01-21 | Disposition: A | Discharge status: Home / Self Care | Payer: BC Managed Care – PPO | Source: Ambulatory Visit | Attending: Cardiology | Admitting: Cardiology

## 2023-01-21 DIAGNOSIS — R9431 Abnormal electrocardiogram [ECG] [EKG]: Secondary | ICD-10-CM | POA: Diagnosis not present

## 2023-01-21 DIAGNOSIS — R0600 Dyspnea, unspecified: Secondary | ICD-10-CM | POA: Diagnosis not present

## 2023-01-21 DIAGNOSIS — Z87891 Personal history of nicotine dependence: Secondary | ICD-10-CM | POA: Insufficient documentation

## 2023-01-21 DIAGNOSIS — R079 Chest pain, unspecified: Secondary | ICD-10-CM

## 2023-01-21 LAB — EXERCISE TOLERANCE TEST
Angina Index: 0
Duke Treadmill Score: 6
Estimated workload: 7
Exercise duration (min): 5 min
Exercise duration (sec): 44 s
Peak HR: 169 {beats}/min
Percent HR: 103 %
Rest HR: 88 {beats}/min
ST Depression (mm): 0 mm

## 2023-01-29 ENCOUNTER — Telehealth: Payer: Self-pay | Admitting: Cardiology

## 2023-01-29 NOTE — Telephone Encounter (Signed)
Called and spoke with patient. Informed her of the following from Dr. Herbie Baltimore.  Management treadmill stress test results:   Reduced exercise capacity only lasted under 6 minutes.  However there were no EKG changes or symptoms to suggest heart artery blockages.   Relatively low risk study   Bryan Lemma, MD  Patient verbalizes understanding.

## 2023-01-29 NOTE — Telephone Encounter (Signed)
Patient is returning phone call about results. 

## 2023-01-30 ENCOUNTER — Encounter: Payer: Self-pay | Admitting: Gastroenterology

## 2023-01-31 ENCOUNTER — Ambulatory Visit: Payer: BC Managed Care – PPO | Admitting: Anesthesiology

## 2023-01-31 ENCOUNTER — Encounter: Payer: Self-pay | Admitting: Gastroenterology

## 2023-01-31 ENCOUNTER — Encounter
Admission: RE | Disposition: A | Discharge status: Home / Self Care | Payer: Self-pay | Source: Ambulatory Visit | Attending: Gastroenterology

## 2023-01-31 ENCOUNTER — Ambulatory Visit: Payer: BC Managed Care – PPO | Admitting: Physician Assistant

## 2023-01-31 ENCOUNTER — Ambulatory Visit
Admission: RE | Admit: 2023-01-31 | Discharge: 2023-01-31 | Disposition: A | Discharge status: Home / Self Care | Payer: BC Managed Care – PPO | Source: Ambulatory Visit | Attending: Gastroenterology | Admitting: Gastroenterology

## 2023-01-31 ENCOUNTER — Other Ambulatory Visit: Payer: Self-pay

## 2023-01-31 DIAGNOSIS — D122 Benign neoplasm of ascending colon: Secondary | ICD-10-CM | POA: Diagnosis not present

## 2023-01-31 DIAGNOSIS — Z800 Family history of malignant neoplasm of digestive organs: Secondary | ICD-10-CM

## 2023-01-31 DIAGNOSIS — K5730 Diverticulosis of large intestine without perforation or abscess without bleeding: Secondary | ICD-10-CM | POA: Diagnosis not present

## 2023-01-31 DIAGNOSIS — Z860100 Personal history of colon polyps, unspecified: Secondary | ICD-10-CM

## 2023-01-31 DIAGNOSIS — Z87891 Personal history of nicotine dependence: Secondary | ICD-10-CM | POA: Diagnosis not present

## 2023-01-31 DIAGNOSIS — Z1211 Encounter for screening for malignant neoplasm of colon: Secondary | ICD-10-CM | POA: Insufficient documentation

## 2023-01-31 DIAGNOSIS — K635 Polyp of colon: Secondary | ICD-10-CM | POA: Insufficient documentation

## 2023-01-31 DIAGNOSIS — D120 Benign neoplasm of cecum: Secondary | ICD-10-CM | POA: Diagnosis not present

## 2023-01-31 DIAGNOSIS — D126 Benign neoplasm of colon, unspecified: Secondary | ICD-10-CM

## 2023-01-31 HISTORY — PX: POLYPECTOMY: SHX5525

## 2023-01-31 HISTORY — PX: COLONOSCOPY WITH PROPOFOL: SHX5780

## 2023-01-31 SURGERY — COLONOSCOPY WITH PROPOFOL
Anesthesia: General

## 2023-01-31 MED ORDER — SODIUM CHLORIDE 0.9 % IV SOLN: INTRAVENOUS | Status: DC

## 2023-01-31 MED ORDER — LIDOCAINE HCL (PF) 2 % IJ SOLN
INTRAMUSCULAR | Status: AC
  Filled 2023-01-31: qty 5

## 2023-01-31 MED ORDER — PROPOFOL 10 MG/ML IV BOLUS
INTRAVENOUS | Status: AC
  Filled 2023-01-31: qty 40

## 2023-01-31 MED ORDER — LIDOCAINE HCL (CARDIAC) PF 100 MG/5ML IV SOSY
PREFILLED_SYRINGE | INTRAVENOUS | Status: DC | PRN
  Administered 2023-01-31: 40 mg via INTRAVENOUS

## 2023-01-31 MED ORDER — PROPOFOL 10 MG/ML IV BOLUS
INTRAVENOUS | Status: DC | PRN
  Administered 2023-01-31: 100 mg via INTRAVENOUS
  Administered 2023-01-31: 100 ug/kg/min via INTRAVENOUS

## 2023-01-31 NOTE — Transfer of Care (Signed)
Immediate Anesthesia Transfer of Care Note  Patient: Nicole Beard  Procedure(s) Performed: COLONOSCOPY WITH PROPOFOL POLYPECTOMY  Patient Location: PACU  Anesthesia Type:MAC  Level of Consciousness: awake  Airway & Oxygen Therapy: Patient Spontanous Breathing  Post-op Assessment: Report given to RN and Post -op Vital signs reviewed and stable  Post vital signs: Reviewed and stable  Last Vitals:  Vitals Value Taken Time  BP    Temp    Pulse    Resp    SpO2      Last Pain:  Vitals:   01/31/23 0700  TempSrc: Temporal  PainSc: 0-No pain         Complications: No notable events documented.

## 2023-01-31 NOTE — H&P (Signed)
Wyline Mood, MD [address], [place], [place], [address] [address], [address], [place], [place], [address] Phone: [phone]  Fax: [phone]  Primary Care Physician:  Ronnald Ramp, MD   Pre-Procedure History & Physical: HPI:  Nicole Beard is a 57 y.o. female is here for an colonoscopy.   Past Medical History:  Diagnosis Date   Allergy    Atypical squamous cell changes of undetermined significance (ASCUS) on cervical cytology with negative high risk human papilloma virus (HPV) test result 12/07/2015    Past Surgical History:  Procedure Laterality Date   BREAST CYST ASPIRATION Right 2006   neg   COLONOSCOPY     COLONOSCOPY WITH PROPOFOL N/A 05/12/2017   Procedure: COLONOSCOPY WITH PROPOFOL;  Surgeon: Wyline Mood, MD;  Location: [facility] ENDOSCOPY;  Service: Gastroenterology;  Laterality: N/A;    Prior to Admission medications   Medication Sig Start Date End Date Taking? Authorizing Provider  Vitamin D, Ergocalciferol, (DRISDOL) 1.25 MG (50000 UNIT) CAPS capsule Take 1 capsule (50,000 Units total) by mouth every 7 (seven) days. 09/30/22  Yes Dove, Myra C, MD  ergocalciferol (VITAMIN D2) 1.25 MG (50000 UT) capsule Take by mouth. Patient not taking: Reported on 09/24/2022 01/21/17   [provider]    Allergies as of 11/18/2022 - Review Complete 10/28/2022  Allergen Reaction Noted   Codeine Nausea And Vomiting and Other (See Comments) 11/25/2014   Penicillins Rash 11/25/2014    Family History  Problem Relation Age of Onset   Colon cancer Mother 86 - 18   Hypertension Father    Diabetes Father    Heart disease Father 10 - 47   Heart disease Maternal Grandmother 85 - 69   Heart attack Paternal Grandmother    Breast cancer Neg Hx    Ovarian cancer Neg Hx     Social History   Socioeconomic History   Marital status: Married    Spouse name: Not on file   Number of children: Not on file   Years of education: Not on file   Highest  education level: Not on file  Occupational History   Not on file  Tobacco Use   Smoking status: Former    Types: Cigarettes   Smokeless tobacco: Never   Tobacco comments:    Patient reports she stopped smoking 30 years ago  Vaping Use   Vaping status: Never Used  Substance and Sexual Activity   Alcohol use: No   Drug use: No   Sexual activity: Yes    Birth control/protection: Condom  Other Topics Concern   Not on file  Social History Narrative   Not on file   Social Determinants of Health   Financial Resource Strain: Not on file  Food Insecurity: Not on file  Transportation Needs: Not on file  Physical Activity: Not on file  Stress: Not on file  Social Connections: Not on file  Intimate Partner Violence: Not on file    Review of Systems: See HPI, otherwise negative ROS  Physical Exam: BP 129/68   Pulse 70   Temp (!) 96.9 F (36.1 C) (Temporal)   Resp 16   Ht 5\' 6"  (1.676 m)   Wt 84.8 kg   SpO2 98%   BMI 30.18 kg/m  General:   Alert,  pleasant and cooperative in NAD Head:  Normocephalic and atraumatic. Neck:  Supple; no masses or thyromegaly. Lungs:  Clear throughout to auscultation, normal respiratory effort.    Heart:  +S1, +S2, Regular rate and  rhythm, No edema. Abdomen:  Soft, nontender and nondistended. Normal bowel sounds, without guarding, and without rebound.   Neurologic:  Alert and  oriented x4;  grossly normal neurologically.  Impression/Plan: Nicole Beard is here for an colonoscopy to be performed for surveillance due to prior history of colon polyps   Risks, benefits, limitations, and alternatives regarding  colonoscopy have been reviewed with the patient.  Questions have been answered.  All parties agreeable.   Wyline Mood, MD  01/31/2023, 7:41 AM

## 2023-01-31 NOTE — Anesthesia Preprocedure Evaluation (Signed)
Anesthesia Evaluation  Patient identified by MRN, date of birth, ID band Patient awake    Reviewed: Allergy & Precautions, NPO status , Patient's Chart, lab work & pertinent test results  History of Anesthesia Complications Negative for: history of anesthetic complications  Airway Mallampati: II  TM Distance: <3 FB Neck ROM: Full    Dental no notable dental hx. (+) Teeth Intact   Pulmonary neg pulmonary ROS, neg sleep apnea, neg COPD, Patient abstained from smoking.Not current smoker, former smoker   Pulmonary exam normal breath sounds clear to auscultation       Cardiovascular Exercise Tolerance: Good METS(-) hypertension(-) CAD and (-) Past MI negative cardio ROS (-) dysrhythmias  Rhythm:Regular Rate:Normal - Systolic murmurs    Neuro/Psych negative neurological ROS  negative psych ROS   GI/Hepatic ,neg GERD  ,,(+)     (-) substance abuse    Endo/Other  neg diabetes    Renal/GU negative Renal ROS     Musculoskeletal   Abdominal   Peds  Hematology   Anesthesia Other Findings Past Medical History: No date: Allergy 12/07/2015: Atypical squamous cell changes of undetermined  significance (ASCUS) on cervical cytology with negative high risk  human papilloma virus (HPV) test result  Reproductive/Obstetrics                             Anesthesia Physical Anesthesia Plan  ASA: 1  Anesthesia Plan: General   Post-op Pain Management: Minimal or no pain anticipated   Induction: Intravenous  PONV Risk Score and Plan: 3 and Propofol infusion, TIVA and Ondansetron  Airway Management Planned: Nasal Cannula  Additional Equipment: None  Intra-op Plan:   Post-operative Plan:   Informed Consent: I have reviewed the patients History and Physical, chart, labs and discussed the procedure including the risks, benefits and alternatives for the proposed anesthesia with the patient or  authorized representative who has indicated his/her understanding and acceptance.     Dental advisory given  Plan Discussed with: CRNA and Surgeon  Anesthesia Plan Comments: (Discussed risks of anesthesia with patient, including possibility of difficulty with spontaneous ventilation under anesthesia necessitating airway intervention, PONV, and rare risks such as cardiac or respiratory or neurological events, and allergic reactions. Discussed the role of CRNA in patient's perioperative care. Patient understands.)       Anesthesia Quick Evaluation

## 2023-01-31 NOTE — Anesthesia Postprocedure Evaluation (Signed)
Anesthesia Post Note  Patient: Nicole Beard  Procedure(s) Performed: COLONOSCOPY WITH PROPOFOL POLYPECTOMY  Patient location during evaluation: Endoscopy Anesthesia Type: General Level of consciousness: awake and alert Pain management: pain level controlled Vital Signs Assessment: post-procedure vital signs reviewed and stable Respiratory status: spontaneous breathing, nonlabored ventilation, respiratory function stable and patient connected to nasal cannula oxygen Cardiovascular status: blood pressure returned to baseline and stable Postop Assessment: no apparent nausea or vomiting Anesthetic complications: no   No notable events documented.   Last Vitals:  Vitals:   01/31/23 0811 01/31/23 0821  BP: 114/78 115/78  Pulse: 76   Resp:  16  Temp:    SpO2: 99% 100%    Last Pain:  Vitals:   01/31/23 0801  TempSrc: Temporal  PainSc: Asleep                 Corinda Gubler

## 2023-01-31 NOTE — Op Note (Signed)
[facility] Gastroenterology Patient Name: Nicole Beard Procedure Date: 01/31/2023 7:43 AM MRN: [id number] Account #: [id number] Date of Birth: 1965-09-02 Admit Type: Inpatient Age: 57 Room: [facility] ENDO ROOM 4 Gender: Female Note Status: Finalized Instrument Name: Nelda Marseille [id number] Procedure:             Colonoscopy Indications:           Surveillance: Personal history of adenomatous polyps                         on last colonoscopy > 5 years ago, Last colonoscopy:                         December 2018 Providers:             Wyline Mood MD, MD Medicines:             Monitored Anesthesia Care Complications:         No immediate complications. Procedure:             Pre-Anesthesia Assessment:                        - Prior to the procedure, a History and Physical was                         performed, and patient medications, allergies and                         sensitivities were reviewed. The patient's tolerance                         of previous anesthesia was reviewed.                        - The risks and benefits of the procedure and the                         sedation options and risks were discussed with the                         patient. All questions were answered and informed                         consent was obtained.                        - ASA Grade Assessment: II - A patient with mild                         systemic disease.                        After obtaining informed consent, the colonoscope was                         passed under direct vision. Throughout the procedure,                         the patient's blood pressure, pulse, and oxygen  saturations were monitored continuously. The                         Colonoscope was introduced through the anus and                         advanced to the the cecum, identified by the                         appendiceal orifice. The colonoscopy was performed                          with ease. The patient tolerated the procedure well.                         The quality of the bowel preparation was excellent.                         The ileocecal valve, appendiceal orifice, and rectum                         were photographed. Findings:      The perianal and digital rectal examinations were normal.      Multiple medium-mouthed diverticula were found in the sigmoid colon.      Two sessile polyps were found in the ascending colon and cecum. The       polyps were 4 to 5 mm in size. These polyps were removed with a cold       snare. Resection and retrieval were complete.      The exam was otherwise without abnormality on direct and retroflexion       views. Impression:            - Diverticulosis in the sigmoid colon.                        - Two 4 to 5 mm polyps in the ascending colon and in                         the cecum, removed with a cold snare. Resected and                         retrieved.                        - The examination was otherwise normal on direct and                         retroflexion views. Recommendation:        - Discharge patient to home (with escort).                        - Resume previous diet.                        - Continue present medications.                        - Await pathology results.                        -  Repeat colonoscopy in 7 years for surveillance based                         on pathology results. Procedure Code(s):     --- Professional ---                        [phone], Colonoscopy, flexible; with removal of                         tumor(s), polyp(s), or other lesion(s) by snare                         technique Diagnosis Code(s):     --- Professional ---                        Z86.010, Personal history of colonic polyps                        D12.2, Benign neoplasm of ascending colon                        D12.0, Benign neoplasm of cecum                        K57.30, Diverticulosis of large  intestine without                         perforation or abscess without bleeding CPT copyright 2022 American Medical Association. All rights reserved. The codes documented in this report are preliminary and upon coder review may  be revised to meet current compliance requirements. Wyline Mood, MD Wyline Mood MD, MD 01/31/2023 7:59:04 AM This report has been signed electronically. Number of Addenda: 0 Note Initiated On: 01/31/2023 7:43 AM Scope Withdrawal Time: 0 hours 9 minutes 36 seconds  Total Procedure Duration: 0 hours 11 minutes 27 seconds  Estimated Blood Loss:  Estimated blood loss: none.      [facility]

## 2023-02-03 ENCOUNTER — Encounter: Payer: Self-pay | Admitting: Gastroenterology

## 2023-02-03 NOTE — Group Note (Deleted)

## 2023-02-04 ENCOUNTER — Encounter: Payer: Self-pay | Admitting: Gastroenterology

## 2023-02-09 NOTE — Progress Notes (Deleted)
Cardiology Office Note    Date:  02/09/2023   ID:  Nicole Beard, DOB 1966/04/22, MRN [id number]  PCP:  Ronnald Ramp, MD  Cardiologist:  Bryan Lemma, MD  Electrophysiologist:  None   Chief Complaint: Follow-up  History of Present Illness:   Nicole Beard is a 57 y.o. female with history of HLD, ASCUS, and prior tobacco use greater than 30 years ago who presents for follow-up of ETT.  She was evaluated as a new patient by Dr. Herbie Baltimore on 01/09/2023 for intermittent resting left upper sided chest pain with some reproducibility on exam.  Discomfort was often worse when taking a deep breath at the request of her PCP.  Prior to cardiology evaluation, she underwent chest x-ray which showed no active cardiopulmonary disease.  EKG in our office showed sinus rhythm with low voltage QRS.  ETT on 01/21/2023 showed reduced exercise capacity without angina reported with the study terminated due to fatigue and dyspnea.  No diagnostic ST segment or T wave abnormalities were observed.,  This was read as a low risk exercise tolerance stress test with impaired exercise capacity.  ***   Labs independently reviewed: 08/2022 - TSH normal, TC 223, TG 178, HDL 63, LDL 129, A1c 5.4, BUN 9, serum creatinine 0.72, potassium 4.0, albumin 4.6, AST/ALT not elevated, Hgb 14.3, PLT 260  Past Medical History:  Diagnosis Date   Allergy    Atypical squamous cell changes of undetermined significance (ASCUS) on cervical cytology with negative high risk human papilloma virus (HPV) test result 12/07/2015    Past Surgical History:  Procedure Laterality Date   BREAST CYST ASPIRATION Right 2006   neg   COLONOSCOPY     COLONOSCOPY WITH PROPOFOL N/A 05/12/2017   Procedure: COLONOSCOPY WITH PROPOFOL;  Surgeon: Wyline Mood, MD;  Location: [facility] ENDOSCOPY;  Service: Gastroenterology;  Laterality: N/A;   COLONOSCOPY WITH PROPOFOL N/A 01/31/2023   Procedure: COLONOSCOPY WITH PROPOFOL;  Surgeon: Wyline Mood,  MD;  Location: [facility] ENDOSCOPY;  Service: Gastroenterology;  Laterality: N/A;   POLYPECTOMY  01/31/2023   Procedure: POLYPECTOMY;  Surgeon: Wyline Mood, MD;  Location: [facility] ENDOSCOPY;  Service: Gastroenterology;;    Current Medications: No outpatient medications have been marked as taking for the 02/13/23 encounter (Appointment) with Sondra Barges, PA-C.    Allergies:   Codeine and Penicillins   Social History   Socioeconomic History   Marital status: Married    Spouse name: Not on file   Number of children: Not on file   Years of education: Not on file   Highest education level: Not on file  Occupational History   Not on file  Tobacco Use   Smoking status: Former    Types: Cigarettes   Smokeless tobacco: Never   Tobacco comments:    Patient reports she stopped smoking 30 years ago  Vaping Use   Vaping status: Never Used  Substance and Sexual Activity   Alcohol use: No   Drug use: No   Sexual activity: Yes    Birth control/protection: Condom  Other Topics Concern   Not on file  Social History Narrative   Not on file   Social Determinants of Health   Financial Resource Strain: Not on file  Food Insecurity: Not on file  Transportation Needs: Not on file  Physical Activity: Not on file  Stress: Not on file  Social Connections: Not on file     Family History:  The patient's family history includes Colon cancer (age of onset: 23 -  37) in her mother; Diabetes in her father; Heart attack in her paternal grandmother; Heart disease (age of onset: 8 - 28) in her father; Heart disease (age of onset: 25 - 80) in her maternal grandmother; Hypertension in her father. There is no history of Breast cancer or Ovarian cancer.  ROS:   12-point review of systems is negative unless otherwise noted in the HPI.   EKGs/Labs/Other Studies Reviewed:    Studies reviewed were summarized above. The additional studies were reviewed today:  ETT 01/21/2023:   Baseline ECG demonstrates normal  sinus rhythm with lateral T wave inversions and nonspecific ST segment changes.   The patient exhibited reduced exercise capacity.  No angina was reported.  The study was terminated due to fatigue and dyspnea.   No diagnostic ST segment or T wave abnormalities were observed.  Slight accentuation of baseline nonspecific ST segment changes was noted.   No significant arrhythmia occurred.   Low-risk exercise tolerance test ([facility] treadmill score = +5).   Low risk exercise tolerance with impaired exercise capacity noted, as detailed above.   EKG:  EKG is ordered today.  The EKG ordered today demonstrates ***  Recent Labs: 09/24/2022: ALT 29; BUN 9; Creatinine, Ser 0.72; Hemoglobin 14.3; Platelets 260; Potassium 4.0; Sodium 140; TSH 1.100  Recent Lipid Panel    Component Value Date/Time   CHOL 223 (H) 09/24/2022 1557   TRIG 178 (H) 09/24/2022 1557   HDL 63 09/24/2022 1557   CHOLHDL 3.5 09/24/2022 1557   LDLCALC 129 (H) 09/24/2022 1557    PHYSICAL EXAM:    VS:  There were no vitals taken for this visit.  BMI: There is no height or weight on file to calculate BMI.  Physical Exam  Wt Readings from Last 3 Encounters:  01/31/23 187 lb (84.8 kg)  01/09/23 191 lb 9.6 oz (86.9 kg)  10/28/22 150 lb 14.4 oz (68.4 kg)     ASSESSMENT & PLAN:   Chest pain:  HLD: TC 223, TG 178, HDL 63, LDL 129.  Obesity:   {Are you ordering a CV Procedure (e.g. stress test, cath, DCCV, TEE, etc)?   Press F2        :[id number]}     Disposition: F/u with Dr. Herbie Baltimore or an APP in ***.   Medication Adjustments/Labs and Tests Ordered: Current medicines are reviewed at length with the patient today.  Concerns regarding medicines are outlined above. Medication changes, Labs and Tests ordered today are summarized above and listed in the Patient Instructions accessible in Encounters.   Signed, Eula Listen, PA-C 02/09/2023 10:56 AM     [facility] [place] [address] [place], [place] [address] [phone]

## 2023-02-10 ENCOUNTER — Ambulatory Visit: Payer: BC Managed Care – PPO | Admitting: Family Medicine

## 2023-02-13 ENCOUNTER — Ambulatory Visit: Payer: BC Managed Care – PPO | Attending: Physician Assistant | Admitting: Physician Assistant

## 2023-03-22 ENCOUNTER — Other Ambulatory Visit: Payer: Self-pay | Admitting: Medical Genetics

## 2023-03-22 DIAGNOSIS — Z006 Encounter for examination for normal comparison and control in clinical research program: Secondary | ICD-10-CM

## 2023-04-07 ENCOUNTER — Other Ambulatory Visit
Admission: RE | Admit: 2023-04-07 | Discharge: 2023-04-07 | Disposition: A | Discharge status: Home / Self Care | Payer: BC Managed Care – PPO | Source: Ambulatory Visit | Attending: Medical Genetics | Admitting: Medical Genetics

## 2023-04-07 DIAGNOSIS — Z006 Encounter for examination for normal comparison and control in clinical research program: Secondary | ICD-10-CM | POA: Insufficient documentation

## 2023-04-15 LAB — HELIX MOLECULAR SCREEN: Genetic Analysis Overall Interpretation: NEGATIVE

## 2023-04-15 LAB — GENECONNECT MOLECULAR SCREEN

## 2024-01-29 ENCOUNTER — Other Ambulatory Visit: Payer: Self-pay | Admitting: Family Medicine

## 2024-01-29 DIAGNOSIS — Z1231 Encounter for screening mammogram for malignant neoplasm of breast: Secondary | ICD-10-CM

## 2024-02-10 NOTE — Progress Notes (Signed)
 Gynecology Annual Exam  PCP: Sharma Coyer, MD  Chief Complaint:  Chief Complaint  Patient presents with   Gynecologic Exam    History of Present Illness:Patient is a 58 y.o. H5E7977 presents for annual exam. The patient has no complaints today.   LMP: No LMP recorded. (Menstrual status: Perimenopausal). Has not had a cycle in over 1 year  -she may have had some spotting with wiping last month    The patient is occasionally sexually active with 1 female partner. She denies dyspareunia.  The patient does perform self breast exams.  There is no notable family history of breast or ovarian cancer in her family.  The patient wears seatbelts: yes.   The patient has regular exercise: has plans to start walking.    The patient denies current symptoms of depression.    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 Lives with her husband and their 28 year of child, feels safe  PCP has Dental up to date Eye exam >2 years ago  Dermatology sees  Denies tobacco, alcohol or illicit drug use     Review of Systems: ROS see HPI   Past Medical History:  Patient Active Problem List   Diagnosis Date Noted   History of colonic polyps 01/31/2023    IMO SNOMED Dx Update Oct 2024    Adenomatous polyp of colon 01/31/2023   Dyslipidemia, goal to be determined 01/11/2023   Establishing care with new doctor, encounter for 10/28/2022   Chest pain at rest 10/28/2022   Family history of colon cancer in mother 09/24/2022   Obesity (BMI 30-39.9) 09/24/2022   History of vitamin D  deficiency 01/21/2018   Vitamin D  deficiency 01/20/2017   History of cervical dysplasia 01/20/2017   Dermatologic disease 10/03/2006    Past Surgical History:  Past Surgical History:  Procedure Laterality Date   BREAST CYST ASPIRATION Right 2006   neg   COLONOSCOPY     COLONOSCOPY WITH PROPOFOL  N/A 05/12/2017   Procedure: COLONOSCOPY WITH PROPOFOL ;  Surgeon: Therisa Bi, MD;   Location: [facility] ENDOSCOPY;  Service: Gastroenterology;  Laterality: N/A;   COLONOSCOPY WITH PROPOFOL  N/A 01/31/2023   Procedure: COLONOSCOPY WITH PROPOFOL ;  Surgeon: Therisa Bi, MD;  Location: [facility] ENDOSCOPY;  Service: Gastroenterology;  Laterality: N/A;   POLYPECTOMY  01/31/2023   Procedure: POLYPECTOMY;  Surgeon: Therisa Bi, MD;  Location: [facility] ENDOSCOPY;  Service: Gastroenterology;;    Gynecologic History:  No LMP recorded. (Menstrual status: Perimenopausal). Last Pap: Results were: 2024 no abnormalities  Last mammogram: 11/2022 Results were: BI-RAD I  Obstetric History: [id number]  Family History:  Family History  Problem Relation Age of Onset   Colon cancer Mother 74 - 41   Hypertension Father    Diabetes Father    Heart disease Father 81 - 31   Heart disease Maternal Grandmother 29 - 69   Heart attack Paternal Grandmother    Breast cancer Neg Hx    Ovarian cancer Neg Hx     Social History:  Social History   Socioeconomic History   Marital status: Married    Spouse name: Not on file   Number of children: Not on file   Years of education: Not on file   Highest education level: Not on file  Occupational History   Not on file  Tobacco Use   Smoking status: Former    Types: Cigarettes   Smokeless tobacco: Never   Tobacco comments:    Patient reports she stopped smoking 30  years ago  Vaping Use   Vaping status: Never Used  Substance and Sexual Activity   Alcohol use: No   Drug use: No   Sexual activity: Yes    Birth control/protection: Condom  Other Topics Concern   Not on file  Social History Narrative   Not on file   Social Drivers of Health   Financial Resource Strain: Not on file  Food Insecurity: Not on file  Transportation Needs: Not on file  Physical Activity: Not on file  Stress: Not on file  Social Connections: Not on file  Intimate Partner Violence: Not on file    Allergies:  Allergies  Allergen Reactions   Codeine Nausea And Vomiting and Other  (See Comments)   Penicillins Rash    Medications: Prior to Admission medications   Medication Sig Start Date End Date Taking? Authorizing Provider  ergocalciferol  (VITAMIN D2) 1.25 MG (50000 UT) capsule Take by mouth. Patient not taking: Reported on 09/24/2022 01/21/17   [provider]  Vitamin D , Ergocalciferol , (DRISDOL ) 1.25 MG (50000 UNIT) CAPS capsule Take 1 capsule (50,000 Units total) by mouth every 7 (seven) days. 09/30/22   Starla Harland BROCKS, MD    Physical Exam Vitals: There were no vitals taken for this visit.  General: NAD HEENT: normocephalic, anicteric Thyroid: no enlargement, no palpable nodules Pulmonary: No increased work of breathing, CTAB Cardiovascular: RRR, distal pulses 2+ Breast: Breast symmetrical, no tenderness, no palpable nodules or masses, no skin or nipple retraction present, no nipple discharge.  No axillary or supraclavicular lymphadenopathy. Abdomen: NABS, soft, non-tender, non-distended.  Umbilicus without lesions.  No hepatomegaly, splenomegaly or masses palpable. No evidence of hernia  Genitourinary:  External: Normal external female genitalia.  Normal urethral meatus, normal Bartholin's and Skene's glands.    Vagina: Normal vaginal mucosa, no evidence of prolapse.    Cervix: Grossly normal in appearance, no bleeding  Uterus: Non-enlarged, mobile, normal contour.  No CMT  Adnexa: ovaries non-enlarged, no adnexal masses  Rectal: deferred  Lymphatic: no evidence of inguinal lymphadenopathy Extremities: no edema, erythema, or tenderness Neurologic: Grossly intact Psychiatric: mood appropriate, affect full  Female chaperone present for pelvic and breast  portions of the physical exam     Assessment: 58 y.o. H5E7977 routine annual exam  Plan: Problem List Items Addressed This Visit   None Visit Diagnoses       Post-menopausal bleeding    -  Primary   Relevant Orders   US  PELVIS TRANSVAGINAL NON-OB (TV ONLY)     Well woman exam        Relevant Orders   Cytology - PAP   CBC w/Diff/Platelet (Completed)   Hemoglobin A1c (Completed)   Lipid panel (Completed)   VITAMIN D  25 Hydroxy (Vit-D Deficiency, Fractures) (Completed)   TSH + free T4 (Completed)   Comprehensive metabolic panel with GFR (Completed)     Cervical cancer screening       Relevant Orders   Cytology - PAP     Screening examination for venereal disease       Relevant Orders   Cytology - PAP     Screening for endocrine disorder       Relevant Orders   Hemoglobin A1c (Completed)   TSH + free T4 (Completed)     Screening for cholesterol level       Relevant Orders   Lipid panel (Completed)       1) Mammogram - recommend yearly screening mammogram.  Mammogram Was ordered today  2) STI  screening  wasoffered and declined  3) ASCCP guidelines and rational discussed.  Patient opts for yearly screening interval  4) Osteoporosis  - per USPTF routine screening DEXA at age 18   Consider FDA-approved medical therapies in postmenopausal women and men aged 50 years and older, based on the following: a) A hip or vertebral (clinical or morphometric) fracture b) T-score <= -2.5 at the femoral neck or spine after appropriate evaluation to exclude secondary causes C) Low bone mass (T-score between -1.0 and -2.5 at the femoral neck or spine) and a 10-year probability of a hip fracture >= 3% or a 10-year probability of a major osteoporosis-related fracture >= 20% based on the [place] -adapted WHO algorithm   5) Routine healthcare maintenance including cholesterol, diabetes screening discussed managed by PCP HA1C and Vitamin D  collected   6) Colonoscopy last done  in 2024 .  Screening recommended starting at age 91 for average risk individuals, age 26 for individuals deemed at increased risk (including African Americans) and recommended to continue until age 65.  For patient age 46-85 individualized approach is recommended.  Gold standard screening is via colonoscopy,  Cologuard screening is an acceptable alternative for patient unwilling or unable to undergo colonoscopy.  Colorectal cancer screening for average?risk adults: 2018 guideline update from the American Cancer SocietyCA: A Cancer Journal for Clinicians: Oct 23, 2016   7) Reviewed the spotting she saw could be related to normal changes to the vaginal  tissue after menopause, will get a pelvic US .    [name], CNM  [facility] 02/15/2024, 12:09 PM

## 2024-02-11 ENCOUNTER — Other Ambulatory Visit (HOSPITAL_COMMUNITY)
Admission: RE | Admit: 2024-02-11 | Discharge: 2024-02-11 | Disposition: A | Discharge status: Home / Self Care | Source: Ambulatory Visit | Attending: Licensed Practical Nurse | Admitting: Licensed Practical Nurse

## 2024-02-11 ENCOUNTER — Ambulatory Visit: Admitting: Licensed Practical Nurse

## 2024-02-11 VITALS — BP 124/82 | HR 73 | Ht 65.0 in | Wt 190.0 lb

## 2024-02-11 DIAGNOSIS — Z01419 Encounter for gynecological examination (general) (routine) without abnormal findings: Secondary | ICD-10-CM | POA: Diagnosis present

## 2024-02-11 DIAGNOSIS — N950 Postmenopausal bleeding: Secondary | ICD-10-CM | POA: Diagnosis not present

## 2024-02-11 DIAGNOSIS — Z113 Encounter for screening for infections with a predominantly sexual mode of transmission: Secondary | ICD-10-CM

## 2024-02-11 DIAGNOSIS — Z124 Encounter for screening for malignant neoplasm of cervix: Secondary | ICD-10-CM

## 2024-02-11 DIAGNOSIS — Z1329 Encounter for screening for other suspected endocrine disorder: Secondary | ICD-10-CM

## 2024-02-11 DIAGNOSIS — Z13220 Encounter for screening for lipoid disorders: Secondary | ICD-10-CM

## 2024-02-12 LAB — LIPID PANEL
Chol/HDL Ratio: 3.3 ratio (ref 0.0–4.4)
Cholesterol, Total: 209 mg/dL — ABNORMAL HIGH (ref 100–199)
HDL: 63 mg/dL (ref >39)
LDL Chol Calc (NIH): 126 mg/dL — ABNORMAL HIGH (ref 0–99)
Triglycerides: 115 mg/dL (ref 0–149)
VLDL Cholesterol Cal: 20 mg/dL (ref 5–40)

## 2024-02-12 LAB — COMPREHENSIVE METABOLIC PANEL WITH GFR
ALT: 25 IU/L (ref 0–32)
AST: 20 IU/L (ref 0–40)
Albumin: 4.6 g/dL (ref 3.8–4.9)
Alkaline Phosphatase: 124 IU/L (ref 49–135)
BUN/Creatinine Ratio: 16 (ref 9–23)
BUN: 12 mg/dL (ref 6–24)
Bilirubin Total: 0.3 mg/dL (ref 0.0–1.2)
CO2: 25 mmol/L (ref 20–29)
Calcium: 9.5 mg/dL (ref 8.7–10.2)
Chloride: 101 mmol/L (ref 96–106)
Creatinine, Ser: 0.75 mg/dL (ref 0.57–1.00)
Globulin, Total: 2.4 g/dL (ref 1.5–4.5)
Glucose: 81 mg/dL (ref 70–99)
Potassium: 4.2 mmol/L (ref 3.5–5.2)
Sodium: 140 mmol/L (ref 134–144)
Total Protein: 7 g/dL (ref 6.0–8.5)
eGFR: 92 mL/min/1.73 (ref >59)

## 2024-02-12 LAB — CBC WITH DIFFERENTIAL/PLATELET
Basophils Absolute: 0 x10E3/uL (ref 0.0–0.2)
Basos: 1 %
EOS (ABSOLUTE): 0.1 x10E3/uL (ref 0.0–0.4)
Eos: 1 %
Hematocrit: 40 % (ref 34.0–46.6)
Hemoglobin: 13.2 g/dL (ref 11.1–15.9)
Immature Grans (Abs): 0 x10E3/uL (ref 0.0–0.1)
Immature Granulocytes: 0 %
Lymphocytes Absolute: 2 x10E3/uL (ref 0.7–3.1)
Lymphs: 35 %
MCH: 32.3 pg (ref 26.6–33.0)
MCHC: 33 g/dL (ref 31.5–35.7)
MCV: 98 fL — ABNORMAL HIGH (ref 79–97)
Monocytes Absolute: 0.5 x10E3/uL (ref 0.1–0.9)
Monocytes: 9 %
Neutrophils Absolute: 3.1 x10E3/uL (ref 1.4–7.0)
Neutrophils: 54 %
Platelets: 257 x10E3/uL (ref 150–450)
RBC: 4.09 x10E6/uL (ref 3.77–5.28)
RDW: 11.9 % (ref 11.7–15.4)
WBC: 5.8 x10E3/uL (ref 3.4–10.8)

## 2024-02-12 LAB — HEMOGLOBIN A1C
Est. average glucose Bld gHb Est-mCnc: 105 mg/dL
Hgb A1c MFr Bld: 5.3 % (ref 4.8–5.6)

## 2024-02-12 LAB — TSH+FREE T4
Free T4: 1.14 ng/dL (ref 0.82–1.77)
TSH: 1.57 u[IU]/mL (ref 0.450–4.500)

## 2024-02-12 LAB — VITAMIN D 25 HYDROXY (VIT D DEFICIENCY, FRACTURES): Vit D, 25-Hydroxy: 16.4 ng/mL — ABNORMAL LOW (ref 30.0–100.0)

## 2024-02-15 ENCOUNTER — Encounter: Payer: Self-pay | Admitting: Licensed Practical Nurse

## 2024-02-16 LAB — CYTOLOGY - PAP
Chlamydia: NEGATIVE
Comment: NEGATIVE
Comment: NEGATIVE
Comment: NORMAL
Diagnosis: NEGATIVE
Neisseria Gonorrhea: NEGATIVE
Trichomonas: NEGATIVE

## 2024-02-18 ENCOUNTER — Ambulatory Visit: Payer: Self-pay | Admitting: Licensed Practical Nurse

## 2024-02-27 ENCOUNTER — Other Ambulatory Visit

## 2024-02-27 DIAGNOSIS — N950 Postmenopausal bleeding: Secondary | ICD-10-CM

## 2024-03-02 ENCOUNTER — Ambulatory Visit
Admission: RE | Admit: 2024-03-02 | Discharge: 2024-03-02 | Disposition: A | Discharge status: Home / Self Care | Source: Ambulatory Visit | Attending: Family Medicine | Admitting: Family Medicine

## 2024-03-02 DIAGNOSIS — Z1231 Encounter for screening mammogram for malignant neoplasm of breast: Secondary | ICD-10-CM | POA: Diagnosis present

## 2024-04-05 NOTE — Progress Notes (Signed)
 GYNECOLOGY PROGRESS NOTE  Subjective:  PCP: Nicole Coyer, MD  Patient ID: Nicole Beard, female    DOB: 09-Nov-1965, 58 y.o.   MRN: [id number]  HPI  Patient is a 58 y.o. H5E7977 female who presents for endometrial biopsy.  Patient reported some perimenopausal bleeding in September at her annual exam.  She had been amenorrheic for approximately one year.   Nicole Beard CNM ordered TVUS done 02/27/24 which showed posterior intramural fibroid and thickened endometrium.   Patient also would like to start Vitamin D  again and requests a refill.  The following portions of the patient's history were reviewed and updated as appropriate: allergies, current medications, past family history, past medical history, past social history, past surgical history, and problem list.  Review of Systems Pertinent items are noted in HPI.   Objective:   Blood pressure 122/66, pulse 77, weight 184 lb 12.8 oz (83.8 kg). Body mass index is 30.75 kg/m.  General appearance: alert and cooperative Abdomen: soft, non-tender; bowel sounds normal; no masses,  no organomegaly Pelvic: external genitalia normal, no adnexal masses or tenderness, no cervical motion tenderness, positive findings: cervical polyp, rectovaginal septum normal, uterus normal size, shape, and consistency, and vagina normal without discharge Extremities: extremities normal, atraumatic, no cyanosis or edema Neurologic: Grossly normal  TVUS 02/27/24 Indications:PMB Findings:  The uterus is anteverted and measures 8.7  x 4.7 x 4.8 cm with a uterine volume of 103.14 ml. Echo texture is homogenous with evidence of a  focal mass. Within the uterus there is a fairly large intramural  fibroid seen posterior measuring:   Fibroid 1: 4.9 x 3.8 x4.3 cm.    The Endometrium measures 6.6 mm.   Right Ovary was not seen vaginally. Left Ovary measures 2.8 x 1.3 x 2.0 cm. It is normal in appearance. Survey of the adnexa demonstrates no  adnexal masses. There is no free fluid in the cul de sac.   Impression: 1. Fibroid Uterus noted 2. Right Ovary was not seen transvaginally   Recommendations: 1.Clinical correlation with the patient's History and Physical Exam.   Nicole Beard, RDMS (AB,OB,BR),RVT   Clinical Impression and recommendations:   I have reviewed the sonogram results above, combined with the patient's current clinical course, below are my impressions and any appropriate recommendations for management based on the sonographic findings.   Normal irregular shaped uterus with posterior intramural fibroid noted measuring: Fibroid 1: 4.9 x 3.8 x4.3 cm.  Thickened endometrium Normal left ovary.  Right ovary not seen.   In the setting of postmenopausal bleeding and thickened endometrial lining, recommendation for endometrial sampling.   Procedure Note The patient was positioned on the exam table in the dorsal lithotomy position. Bimanual exam confirmed uterine position and size. A speculum was placed into the vagina. A small cervical polyp was noted. Allis clamp applied to polyp and twisting motion removed polyp intact.  Hemostasis obtained.  The pipette was placed into the endocervical canal and advanced to the uterine fundus. Using a piston like technique, with vacuum created by withdrawing the stylus, the endometrial specimen was obtained and transferred to the biopsy container. Minimal bleeding encountered. Both procedures were well tolerated.   Uterine Position: mid   Uterine Length: 8.5cm   Uterine Specimen: Scant   Assessment/Plan:   1. PMB (postmenopausal bleeding)   2. Cervical polyp     58 y.o. [id number], post-menopausal since spring 2025, then with some spotting in summer, first episode, and no prior HRT. TVUS showed EMT of  6.61mm and pt has a 5cm fibroid that she previously did not know about and is asx. Cervical polyp noted on exam today, removed, and EMB performed. Reassurance and etiology is  most likely benign, will MyChart with results.   Nicole Mangle, DO [facility]

## 2024-04-13 ENCOUNTER — Encounter: Payer: Self-pay | Admitting: Obstetrics

## 2024-04-13 ENCOUNTER — Other Ambulatory Visit (HOSPITAL_COMMUNITY)
Admission: RE | Admit: 2024-04-13 | Discharge: 2024-04-13 | Disposition: A | Discharge status: Home / Self Care | Source: Ambulatory Visit | Attending: Obstetrics | Admitting: Obstetrics

## 2024-04-13 ENCOUNTER — Ambulatory Visit: Admitting: Obstetrics

## 2024-04-13 VITALS — BP 122/66 | HR 77 | Wt 184.8 lb

## 2024-04-13 DIAGNOSIS — N841 Polyp of cervix uteri: Secondary | ICD-10-CM | POA: Insufficient documentation

## 2024-04-13 DIAGNOSIS — N950 Postmenopausal bleeding: Secondary | ICD-10-CM | POA: Diagnosis present

## 2024-04-13 DIAGNOSIS — N924 Excessive bleeding in the premenopausal period: Secondary | ICD-10-CM

## 2024-04-15 LAB — SURGICAL PATHOLOGY

## 2024-04-27 ENCOUNTER — Ambulatory Visit: Payer: Self-pay | Admitting: Obstetrics
